# Patient Record
Sex: Male | Born: 1990 | Race: White | Hispanic: No | Marital: Single | Smoking: Current every day smoker
Health system: Southern US, Community
[De-identification: ages and names within clinical notes are randomized; demographics above are authoritative.]

## PROBLEM LIST (undated history)

## (undated) DIAGNOSIS — B1920 Unspecified viral hepatitis C without hepatic coma: Secondary | ICD-10-CM

## (undated) DIAGNOSIS — F1910 Other psychoactive substance abuse, uncomplicated: Secondary | ICD-10-CM

## (undated) DIAGNOSIS — J45909 Unspecified asthma, uncomplicated: Secondary | ICD-10-CM

## (undated) HISTORY — DX: Other psychoactive substance abuse, uncomplicated: F19.10

## (undated) HISTORY — DX: Unspecified viral hepatitis C without hepatic coma: B19.20

## (undated) HISTORY — PX: NO PAST SURGERIES: SHX2092

---
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
## 2010-01-21 ENCOUNTER — Ambulatory Visit: Payer: Self-pay | Admitting: Diagnostic Radiology

## 2010-01-21 ENCOUNTER — Emergency Department (HOSPITAL_BASED_OUTPATIENT_CLINIC_OR_DEPARTMENT_OTHER): Admission: EM | Admit: 2010-01-21 | Discharge: 2010-01-21 | Payer: Self-pay | Admitting: Emergency Medicine

## 2010-03-11 ENCOUNTER — Emergency Department (HOSPITAL_BASED_OUTPATIENT_CLINIC_OR_DEPARTMENT_OTHER): Admission: EM | Admit: 2010-03-11 | Discharge: 2010-03-12 | Payer: Self-pay | Admitting: Emergency Medicine

## 2010-04-10 ENCOUNTER — Emergency Department (HOSPITAL_BASED_OUTPATIENT_CLINIC_OR_DEPARTMENT_OTHER)
Admission: EM | Admit: 2010-04-10 | Discharge: 2010-04-10 | Payer: Self-pay | Source: Home / Self Care | Admitting: Emergency Medicine

## 2010-04-16 ENCOUNTER — Emergency Department (HOSPITAL_BASED_OUTPATIENT_CLINIC_OR_DEPARTMENT_OTHER)
Admission: EM | Admit: 2010-04-16 | Discharge: 2010-04-16 | Payer: Self-pay | Source: Home / Self Care | Admitting: Emergency Medicine

## 2010-05-07 ENCOUNTER — Encounter: Payer: Self-pay | Admitting: Family Medicine

## 2010-05-07 ENCOUNTER — Encounter: Payer: Self-pay | Admitting: Internal Medicine

## 2010-06-29 LAB — BASIC METABOLIC PANEL
CO2: 28 mEq/L (ref 19–32)
Calcium: 9.7 mg/dL (ref 8.4–10.5)
Creatinine, Ser: 1.2 mg/dL (ref 0.4–1.5)
GFR calc Af Amer: >60 mL/min (ref >60)
GFR calc non Af Amer: >60 mL/min (ref >60)
Glucose, Bld: 88 mg/dL (ref 70–99)
Sodium: 141 mEq/L (ref 135–145)

## 2010-06-29 LAB — URINE CULTURE: Culture: NO GROWTH

## 2010-06-29 LAB — DIFFERENTIAL
Basophils Absolute: 0.1 10*3/uL (ref 0.0–0.1)
Basophils Relative: 1 % (ref 0–1)
Eosinophils Absolute: 0.2 10*3/uL (ref 0.0–0.7)
Neutro Abs: 5 10*3/uL (ref 1.7–7.7)
Neutrophils Relative %: 71 % (ref 43–77)

## 2010-06-29 LAB — URINALYSIS, ROUTINE W REFLEX MICROSCOPIC
Nitrite: NEGATIVE
Protein, ur: NEGATIVE mg/dL
Specific Gravity, Urine: 1.028 (ref 1.005–1.030)
Urobilinogen, UA: 0.2 mg/dL (ref 0.0–1.0)

## 2010-06-29 LAB — CBC
Hemoglobin: 15.5 g/dL (ref 13.0–17.0)
MCH: 29.2 pg (ref 26.0–34.0)
MCHC: 33.2 g/dL (ref 30.0–36.0)
Platelets: 245 10*3/uL (ref 150–400)

## 2010-09-14 ENCOUNTER — Emergency Department (HOSPITAL_COMMUNITY)
Admission: EM | Admit: 2010-09-14 | Discharge: 2010-09-15 | Disposition: A | Discharge status: Other Acute Inpatient Hospital | Payer: Self-pay | Attending: Emergency Medicine | Admitting: Emergency Medicine

## 2010-09-14 DIAGNOSIS — F411 Generalized anxiety disorder: Secondary | ICD-10-CM | POA: Insufficient documentation

## 2010-09-14 DIAGNOSIS — R45851 Suicidal ideations: Secondary | ICD-10-CM | POA: Insufficient documentation

## 2010-09-14 DIAGNOSIS — F22 Delusional disorders: Secondary | ICD-10-CM | POA: Insufficient documentation

## 2010-09-14 LAB — COMPREHENSIVE METABOLIC PANEL
AST: 17 U/L (ref 0–37)
Albumin: 4.8 g/dL (ref 3.5–5.2)
Alkaline Phosphatase: 97 U/L (ref 39–117)
BUN: 12 mg/dL (ref 6–23)
Creatinine, Ser: 0.98 mg/dL (ref 0.4–1.5)
GFR calc Af Amer: >60 mL/min (ref >60)
Potassium: 3.9 mEq/L (ref 3.5–5.1)
Total Protein: 7.9 g/dL (ref 6.0–8.3)

## 2010-09-14 LAB — CBC
HCT: 46.7 % (ref 39.0–52.0)
Hemoglobin: 15.9 g/dL (ref 13.0–17.0)
MCHC: 34 g/dL (ref 30.0–36.0)
MCV: 84.9 fL (ref 78.0–100.0)

## 2010-09-14 LAB — RAPID URINE DRUG SCREEN, HOSP PERFORMED
Amphetamines: NOT DETECTED
Opiates: NOT DETECTED
Tetrahydrocannabinol: POSITIVE — AB

## 2010-09-14 LAB — DIFFERENTIAL
Basophils Absolute: 0 10*3/uL (ref 0.0–0.1)
Lymphocytes Relative: 17 % (ref 12–46)
Lymphs Abs: 1.8 10*3/uL (ref 0.7–4.0)
Monocytes Absolute: 0.7 10*3/uL (ref 0.1–1.0)
Monocytes Relative: 7 % (ref 3–12)
Neutro Abs: 7.8 10*3/uL — ABNORMAL HIGH (ref 1.7–7.7)

## 2010-09-15 ENCOUNTER — Inpatient Hospital Stay (HOSPITAL_COMMUNITY)
Admission: AD | Admit: 2010-09-15 | Discharge: 2010-09-17 | DRG: 885 | Disposition: A | Discharge status: Home / Self Care | Payer: PRIVATE HEALTH INSURANCE | Source: Ambulatory Visit | Attending: Psychiatry | Admitting: Psychiatry

## 2010-09-15 DIAGNOSIS — F319 Bipolar disorder, unspecified: Secondary | ICD-10-CM

## 2010-09-15 DIAGNOSIS — Z880 Allergy status to penicillin: Secondary | ICD-10-CM

## 2010-09-15 DIAGNOSIS — Z882 Allergy status to sulfonamides status: Secondary | ICD-10-CM

## 2010-09-15 DIAGNOSIS — Z9119 Patient's noncompliance with other medical treatment and regimen: Secondary | ICD-10-CM

## 2010-09-15 DIAGNOSIS — F909 Attention-deficit hyperactivity disorder, unspecified type: Secondary | ICD-10-CM

## 2010-09-15 DIAGNOSIS — Z91199 Patient's noncompliance with other medical treatment and regimen due to unspecified reason: Secondary | ICD-10-CM

## 2010-09-15 DIAGNOSIS — F39 Unspecified mood [affective] disorder: Secondary | ICD-10-CM

## 2010-09-15 DIAGNOSIS — R45851 Suicidal ideations: Secondary | ICD-10-CM

## 2010-09-15 DIAGNOSIS — Z818 Family history of other mental and behavioral disorders: Secondary | ICD-10-CM

## 2010-09-15 DIAGNOSIS — F1210 Cannabis abuse, uncomplicated: Secondary | ICD-10-CM

## 2010-09-20 NOTE — Discharge Summary (Signed)
  NAMESHAHEER, BONFIELD NO.:  [id number]  MEDICAL RECORD NO.:  [id number]  LOCATION:  0505                          FACILITY:  [facility]  PHYSICIAN:  Franchot Gallo, MD     DATE OF BIRTH:  Dec 06, 1990  DATE OF ADMISSION:  09/15/2010 DATE OF DISCHARGE:  09/17/2010                              DISCHARGE SUMMARY   REASON FOR ADMISSION:  This was a 20 year old male who was here on involuntary papers taken out by his mother with papers saying the patient was having suicidal thoughts and the patient was having a plan to carry out suicide by using a knife, stating that he was going to kill himself.  FINAL IMPRESSION:  AXIS I:  Bipolar by history.  Mood disorder NOS. AXIS II: Deferred. AXIS III: No known medical conditions. AXIS IV: Problems with primary support group, occupation, economic issues. AXIS V:  50-55.  PERTINENT LABS:  Alcohol level less than 11.  Urine drug screen positive for marijuana.  PERTINENT FINDINGS:  This is an alert and oriented male, appropriately groomed, dressed and nourished.  She was admitted to the adult milieu. Contact was parents.  We did initiate the Risperdal for anxiety and to help with the mood stabilization.  He was attending groups.  We had contact with the patient's mother, Aggie Cosier, to provide information and assess any safety concerns.  Mother states that she had no safety concerns for the patient's discharge.  On day of discharge, the patient denied any suicidal or homicidal thoughts.  Felt ready for discharge. Mother was contacted.  She stated that she felt the patient was safe to come home.  His follow-up appointment was with Dr. Evelene Croon on September 18, 2010, and the patient's mother to call for an appointment.     Landry Corporal, N.P.   ______________________________ Franchot Gallo, MD    JO/MEDQ  D:  09/19/2010  T:  09/19/2010  Job:  [id number]  Electronically Signed by Limmie PatriciaP. on 09/20/2010 05:12:48  PM Electronically Signed by Franchot Gallo MD on 09/20/2010 05:19:15 PM

## 2010-09-24 NOTE — H&P (Signed)
Kevin Mack, Kevin Mack NO.:  [id number]  MEDICAL RECORD NO.:  [id number]           PATIENT TYPE:  I  LOCATION:  0505                          FACILITY:  [facility]  PHYSICIAN:  Franchot Gallo, MD     DATE OF BIRTH:  04-28-1990  DATE OF ADMISSION:  09/15/2010 DATE OF DISCHARGE:                      PSYCHIATRIC ADMISSION ASSESSMENT   This is an involuntary admission to the services of Dr. Harvie Heck Elle Vezina.  This is a 20 year old single white male.  His mother took out commitment papers. The commitment papers read that he was having suicidal ideation. He planned to carry out suicide by using a knife. He had held a knife against his neck today and said he was going to kill himself.  He has been diagnosed as ADHD and bipolar.  He has been prescribed Adderall and Lamictal but refuses to take the meds. The respondent has not been committed in the past but he is felt to represent a danger to himself.  PAST PSYCHIATRIC HISTORY:  According to the paperwork he was diagnosed at age 28 with ADD and bipolar.  He himself tells me he quit taking his medication about a year ago.  SOCIAL HISTORY:  He went to the eleventh grade.  He has never married. He has no children.  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 This employment ended around December and he has been at his parents' home since then.  FAMILY HISTORY:  He states that almost every family member has psychiatric illness.  Indeed, his mother and father have both been admitted here at points in the past.  His mother and father and two brothers all have mood disorder of some sort.  ALCOHOL AND DRUG HISTORY:  He does use marijuana. It helps him "calm down."  He has been using since he was a teenager.  PRIMARY CARE PROVIDER:  He does not have one at present.  He has not been to [facility] in about a year but when he was last there he was prescribed Lamictal.  MEDICAL PROBLEMS:  None are  known.  MEDICATIONS:  None are known.  DRUG ALLERGIES:  Penicillin and sulfa.  POSITIVE PHYSICAL FINDINGS:  He was medically cleared in the ED at [facility].  He was afebrile.  His temperature was 97.5-98.7.  His respirations ranged from 16-20.  His pulse from 52-102 and his blood pressure from 101/69 to 132/68.  LAB STUDIES:  Showed that he had no measurable alcohol.  His UDS was positive for marijuana only. He had no abnormalities of CBC nor metabolic profile.  MENTAL STATUS EXAM:  Today he is alert and oriented.  He is appropriately groomed, dressed and nourished.  He is dressed in hospital scrubs.  His speech is not pressured.  His mood is calm. His affect is in normal range. His thought processes are clear, rational and goal oriented.  Concentration and memory are intact.  Intelligence is average and judgment and insight appear to be fair.  He convincingly denies being suicidal or homicidal.  He denies auditory or visual hallucinations.  He does acknowledge holding a knife in  his hand yesterday. He denies actually holding it at his neck.  Apparently his father was going to let some man who has been at their home before, help with remodelling the house.  In the past according to the patient the man had said something to the effect that he wished the patient and his brother were dead so he can get together with his mother. The patient was unusually insistent that this man not be in the house and this was what led to the commitment papers. He was seen in the emergency department by Dr. Rogers Blocker.  Dr. Rogers Blocker did not note any flight of ideas.  He did not seem to be delusional.  He was alert and oriented. He felt that the patient was not suicidal or homicidal; however, he did feel that the patient had a mood disorder NOS and he felt that he should be started on Risperdal.  DIAGNOSES:  AXIS I:  Diagnosis is bipolar by history, ADHD by history, mood disorder NOS. AXIS II:   Deferred. AXIS III:  Healthy. AXIS IV:  Problems with primary support group, occupation, economic issues. AXIS V:  35.  PLAN:  Admit for safety and stabilization.  It was explained to the patient that because he is on involuntary commitment, the first item on the agenda will be to get him converted to a voluntary admission.  We would have the social worker or counselor contact his parents and come in for a session where if they felt it was reasonable for him to be discharged, we could contemplate discharge as per his request. Estimated length of stay is 1-2 days.     Mickie Leonarda Salon, P.A.-C.   ______________________________ Franchot Gallo, MD    MD/MEDQ  D:  09/15/2010  T:  09/16/2010  Job:  [id number]  Electronically Signed by Jaci Lazier ADAMS P.A.-C. on 09/24/2010 09:46:44 AM Electronically Signed by Franchot Gallo MD on 09/24/2010 07:04:34 PM

## 2011-01-12 LAB — POCT RAPID STREP A: Streptococcus, Group A Screen (Direct): NEGATIVE

## 2011-01-12 LAB — POCT INFECTIOUS MONO SCREEN: Mono Screen: NEGATIVE

## 2011-01-15 LAB — RAPID URINE DRUG SCREEN, HOSP PERFORMED
Cocaine: NOT DETECTED
Opiates: NOT DETECTED

## 2011-03-05 ENCOUNTER — Encounter: Payer: Self-pay | Admitting: *Deleted

## 2011-03-05 ENCOUNTER — Emergency Department (HOSPITAL_COMMUNITY): Payer: Self-pay

## 2011-03-05 ENCOUNTER — Emergency Department (HOSPITAL_COMMUNITY)
Admission: EM | Admit: 2011-03-05 | Discharge: 2011-03-05 | Disposition: A | Discharge status: Home / Self Care | Payer: Self-pay | Attending: Emergency Medicine | Admitting: Emergency Medicine

## 2011-03-05 DIAGNOSIS — S61209A Unspecified open wound of unspecified finger without damage to nail, initial encounter: Secondary | ICD-10-CM | POA: Insufficient documentation

## 2011-03-05 DIAGNOSIS — S61218A Laceration without foreign body of other finger without damage to nail, initial encounter: Secondary | ICD-10-CM

## 2011-03-05 DIAGNOSIS — W298XXA Contact with other powered powered hand tools and household machinery, initial encounter: Secondary | ICD-10-CM | POA: Insufficient documentation

## 2011-03-05 MED ORDER — OXYCODONE-ACETAMINOPHEN 5-325 MG PO TABS
2.0000 | ORAL_TABLET | Freq: Once | ORAL | Status: AC
  Administered 2011-03-05: 2 via ORAL
  Filled 2011-03-05: qty 2

## 2011-03-05 MED ORDER — HYDROCODONE-ACETAMINOPHEN 5-500 MG PO TABS: 1.0000 | ORAL_TABLET | Freq: Four times a day (QID) | ORAL | Status: AC | PRN

## 2011-03-05 MED ORDER — CEPHALEXIN 500 MG PO CAPS: 500.0000 mg | ORAL_CAPSULE | Freq: Four times a day (QID) | ORAL | Status: AC

## 2011-03-05 NOTE — Discharge Instructions (Signed)
Fingernail Removal Fingernails may need to be removed because of injury, infections, or correction of abnormal growth. A special non-stick bandage has been put on your finger tightly to prevent bleeding. Fingernails will usually grow back if the finger has not been badly injured and you carefully follow instructions. HOME CARE INSTRUCTIONS   Keep your hand elevated above your heart to relieve pain and swelling.   Keep your dressing dry and clean.   Change your bandage in 24 hours.   After your bandage is changed, soak your hand in warm soapy water for 10 to 20 minutes. Do this 3 times per day. This helps reduce pain and swelling. After soaking your hand, apply a clean, dry bandage. Change your bandage if it is wet or dirty.   Only take over-the-counter or prescription medicines for pain, discomfort, or fever as directed by your caregiver.   See your caregiver as needed for problems.   You may have received an instruction to follow up with your caregiver or a specialist. The failure to follow up as instructed could result in the permanent loss of a fingernail.  SEEK IMMEDIATE MEDICAL CARE IF:   You have increased pain, swelling, drainage, or bleeding.   You have a fever.  MAKE SURE YOU:   Understand these instructions.   Will watch your condition.   Will get help right away if you are not doing well or get worse.  Document Released: 03/30/2000 Document Revised: 12/13/2010 Document Reviewed: 08/05/2007 [facility] Patient Information 2012 [place], [place].Fingertip Laceration The treatment of fingertip injuries depends on how large the cut is and whether the bone or nail tissue has been damaged. Amputations of the skin over the tip of the finger that is smaller than a dime (smaller than 1cm) will usually heal very well from the sides without any treatment other than cleaning the wound and changing the dressing. Keep your hand elevated for the next 2 to 3 days to reduce pain and swelling. A  splint over the fingertip may be needed to protect your injury. If your cut is being allowed to heal in from the sides, you should soak it in warm water and change the dressing daily.  You may need a tetanus shot if:  You cannot remember when you had your last tetanus shot.   You have never had a tetanus shot.   The injury broke your skin.  If you got a tetanus shot, your arm may swell, get red, and feel warm to the touch. This is common and not a problem. If you need a tetanus shot and you choose not to have one, there is a rare chance of getting tetanus. Sickness from tetanus can be serious. SEEK MEDICAL CARE IF:   There are any signs of infection: increased redness, swelling, and pain, or sometimes pus drainage.  Document Released: 05/10/2004 Document Revised: 12/13/2010 Document Reviewed: 05/06/2008 [facility] Patient Information 2012 [place], [place].Laceration Care, Adult A laceration is a cut or lesion that goes through all layers of the skin and into the tissue just beneath the skin. TREATMENT  Some lacerations may not require closure. Some lacerations may not be able to be closed due to an increased risk of infection. It is important to see your caregiver as soon as possible after an injury to minimize the risk of infection and maximize the opportunity for successful closure. If closure is appropriate, pain medicines may be given, if needed. The wound will be cleaned to help prevent infection. Your caregiver will use  stitches (sutures), staples, wound glue (adhesive), or skin adhesive strips to repair the laceration. These tools bring the skin edges together to allow for faster healing and a better cosmetic outcome. However, all wounds will heal with a scar. Once the wound has healed, scarring can be minimized by covering the wound with sunscreen during the day for 1 full year. HOME CARE INSTRUCTIONS  For sutures or staples:  Keep the wound clean and dry.   If you were given a bandage  (dressing), you should change it at least once a day. Also, change the dressing if it becomes wet or dirty, or as directed by your caregiver.   Wash the wound with soap and water 2 times a day. Rinse the wound off with water to remove all soap. Pat the wound dry with a clean towel.   After cleaning, apply a thin layer of the antibiotic ointment as recommended by your caregiver. This will help prevent infection and keep the dressing from sticking.   You may shower as usual after the first 24 hours. Do not soak the wound in water until the sutures are removed.   Only take over-the-counter or prescription medicines for pain, discomfort, or fever as directed by your caregiver.   Get your sutures or staples removed as directed by your caregiver.  For skin adhesive strips:  Keep the wound clean and dry.   Do not get the skin adhesive strips wet. You may bathe carefully, using caution to keep the wound dry.   If the wound gets wet, pat it dry with a clean towel.   Skin adhesive strips will fall off on their own. You may trim the strips as the wound heals. Do not remove skin adhesive strips that are still stuck to the wound. They will fall off in time.  For wound adhesive:  You may briefly wet your wound in the shower or bath. Do not soak or scrub the wound. Do not swim. Avoid periods of heavy perspiration until the skin adhesive has fallen off on its own. After showering or bathing, gently pat the wound dry with a clean towel.   Do not apply liquid medicine, cream medicine, or ointment medicine to your wound while the skin adhesive is in place. This may loosen the film before your wound is healed.   If a dressing is placed over the wound, be careful not to apply tape directly over the skin adhesive. This may cause the adhesive to be pulled off before the wound is healed.   Avoid prolonged exposure to sunlight or tanning lamps while the skin adhesive is in place. Exposure to ultraviolet light in  the first year will darken the scar.   The skin adhesive will usually remain in place for 5 to 10 days, then naturally fall off the skin. Do not pick at the adhesive film.  You may need a tetanus shot if:  You cannot remember when you had your last tetanus shot.   You have never had a tetanus shot.  If you get a tetanus shot, your arm may swell, get red, and feel warm to the touch. This is common and not a problem. If you need a tetanus shot and you choose not to have one, there is a rare chance of getting tetanus. Sickness from tetanus can be serious. SEEK MEDICAL CARE IF:   You have redness, swelling, or increasing pain in the wound.   You see a red line that goes away from the wound.  You have yellowish-white fluid (pus) coming from the wound.   You have a fever.   You notice a bad smell coming from the wound or dressing.   Your wound breaks open before or after sutures have been removed.   You notice something coming out of the wound such as wood or glass.   Your wound is on your hand or foot and you cannot move a finger or toe.  SEEK IMMEDIATE MEDICAL CARE IF:   Your pain is not controlled with prescribed medicine.   You have severe swelling around the wound causing pain and numbness or a change in color in your arm, hand, leg, or foot.   Your wound splits open and starts bleeding.   You have worsening numbness, weakness, or loss of function of any joint around or beyond the wound.   You develop painful lumps near the wound or on the skin anywhere on your body.  MAKE SURE YOU:   Understand these instructions.   Will watch your condition.   Will get help right away if you are not doing well or get worse.  Document Released: 04/02/2005 Document Revised: 12/13/2010 Document Reviewed: 09/26/2010 [facility] Patient Information 2012 [place], [place].

## 2011-03-05 NOTE — ED Notes (Signed)
Pt injured left hand while cutting wood with chainsaw. Bleeding controlled with guaze.

## 2011-03-05 NOTE — ED Notes (Signed)
Affected fingers bandaged and bacitracin applied per order

## 2011-03-05 NOTE — ED Notes (Signed)
Laceration to the lt ring and little fingers with a chain saw approx one hour ago at home.  Bleeding controlled at present

## 2011-03-05 NOTE — ED Provider Notes (Signed)
History     CSN: [id number] Arrival date & time: 03/05/2011  4:45 PM   First MD Initiated Contact with Patient 03/05/11 1828      Chief Complaint  Patient presents with  . Laceration    (Consider location/radiation/quality/duration/timing/severity/associated sxs/prior treatment) Patient is a 20 y.o. male presenting with skin laceration. The history is provided by the patient.  Laceration   pt was cutting fire wood at his home when the chain saw jumped back and cut his left ring and small finger. The bleeding is controlled. He says he can move and feel all five fingers. Denies head injury. Incident happened just prior to arrival. Pt has no significant past medical history.  History reviewed. No pertinent past medical history.  History reviewed. No pertinent past surgical history.  History reviewed. No pertinent family history.  History  Substance Use Topics  . Smoking status: Current Everyday Smoker  . Smokeless tobacco: Not on file  . Alcohol Use: No      Review of Systems  All other systems reviewed and are negative.    Allergies  Penicillins  Home Medications   Current Outpatient Rx  Name Route Sig Dispense Refill  . ACETAMINOPHEN 500 MG PO TABS Oral Take 1,000 mg by mouth as needed. For pain.       BP 129/79  Pulse 53  Temp(Src) 97.6 F (36.4 C) (Oral)  Resp 18  SpO2 97%  Physical Exam  Constitutional: He is oriented to person, place, and time. He appears well-developed and well-nourished.  HENT:  Head: Normocephalic and atraumatic.  Eyes: EOM are normal. Pupils are equal, round, and reactive to light.  Neck: Normal range of motion.  Cardiovascular: Normal rate and regular rhythm.   Pulmonary/Chest: Effort normal and breath sounds normal.  Musculoskeletal: Normal range of motion.       Hands: Neurological: He is alert and oriented to person, place, and time.  Skin: Skin is warm and dry.    ED Course  LACERATION REPAIR Date/Time: 03/05/2011  8:29 PM Performed by: Dorthula Matas Authorized by: Dorthula Matas Consent: Verbal consent obtained. Written consent not obtained. Risks and benefits: risks, benefits and alternatives were discussed Consent given by: patient Patient understanding: patient states understanding of the procedure being performed Patient consent: the patient's understanding of the procedure matches consent given Procedure consent: procedure consent matches procedure scheduled Body area: upper extremity Location details: left small finger Contamination: The wound is contaminated. Foreign bodies: unknown Tendon involvement: none Nerve involvement: none Anesthesia: digital block Local anesthetic: lidocaine 1% without epinephrine Patient sedated: no Preparation: Patient was prepped and draped in the usual sterile fashion. Irrigation solution: saline Amount of cleaning: extensive Debridement: none Degree of undermining: none Skin closure: 4-0 nylon Number of sutures: 3 Technique: simple Approximation: close Approximation difficulty: simple Dressing: 4x4 sterile gauze, antibiotic ointment and splint   (including critical care time)  Labs Reviewed - No data to display No results found.   No diagnosis found.    MDM  xrays are both negative for fracture.       Dorthula Matas, PA 03/05/11 2048

## 2011-03-07 NOTE — ED Provider Notes (Signed)
Medical screening examination/treatment/procedure(s) were performed by non-physician practitioner and as supervising physician I was immediately available for consultation/collaboration.  Raeford Razor, MD 03/07/11 [phone]

## 2012-04-27 ENCOUNTER — Emergency Department (HOSPITAL_BASED_OUTPATIENT_CLINIC_OR_DEPARTMENT_OTHER)
Admission: EM | Admit: 2012-04-27 | Discharge: 2012-04-27 | Disposition: A | Discharge status: Home / Self Care | Payer: Self-pay | Attending: Emergency Medicine | Admitting: Emergency Medicine

## 2012-04-27 ENCOUNTER — Emergency Department (HOSPITAL_BASED_OUTPATIENT_CLINIC_OR_DEPARTMENT_OTHER): Payer: Self-pay

## 2012-04-27 ENCOUNTER — Encounter (HOSPITAL_BASED_OUTPATIENT_CLINIC_OR_DEPARTMENT_OTHER): Payer: Self-pay | Admitting: *Deleted

## 2012-04-27 DIAGNOSIS — F17200 Nicotine dependence, unspecified, uncomplicated: Secondary | ICD-10-CM | POA: Insufficient documentation

## 2012-04-27 DIAGNOSIS — X58XXXA Exposure to other specified factors, initial encounter: Secondary | ICD-10-CM | POA: Insufficient documentation

## 2012-04-27 DIAGNOSIS — Y9383 Activity, rough housing and horseplay: Secondary | ICD-10-CM | POA: Insufficient documentation

## 2012-04-27 DIAGNOSIS — IMO0002 Reserved for concepts with insufficient information to code with codable children: Secondary | ICD-10-CM | POA: Insufficient documentation

## 2012-04-27 DIAGNOSIS — Y929 Unspecified place or not applicable: Secondary | ICD-10-CM | POA: Insufficient documentation

## 2012-04-27 MED ORDER — HYDROCODONE-ACETAMINOPHEN 5-325 MG PO TABS: 2.0000 | ORAL_TABLET | ORAL | Status: AC | PRN

## 2012-04-27 MED ORDER — IBUPROFEN 800 MG PO TABS: 800.0000 mg | ORAL_TABLET | Freq: Three times a day (TID) | ORAL | Status: DC

## 2012-04-27 NOTE — ED Notes (Signed)
Pt states he was "horsing around" with his brother yesterday and injured his lower back. Brought to ED by EMS and ambulatory to ED.

## 2012-04-27 NOTE — ED Provider Notes (Signed)
History     CSN: [id number]  Arrival date & time 04/27/12  1528   First MD Initiated Contact with Patient 04/27/12 1706      Chief Complaint  Patient presents with  . Back Pain    (Consider location/radiation/quality/duration/timing/severity/associated sxs/prior treatment) Patient is a 22 y.o. male presenting with back pain. The history is provided by the patient. No language interpreter was used.  Back Pain  This is a new problem. The current episode started yesterday. The problem occurs constantly. The problem has been gradually worsening. The pain is associated with twisting. The quality of the pain is described as aching. The pain is at a severity of 7/10. The pain is moderate. The pain is the same all the time. Stiffness is present all day. He has tried nothing for the symptoms. The treatment provided moderate relief. Risk factors: none.    History reviewed. No pertinent past medical history.  History reviewed. No pertinent past surgical history.  History reviewed. No pertinent family history.  History  Substance Use Topics  . Smoking status: Current Every Day Smoker  . Smokeless tobacco: Not on file  . Alcohol Use: No      Review of Systems  Musculoskeletal: Positive for back pain.  All other systems reviewed and are negative.    Allergies  Penicillins and Sulfa antibiotics  Home Medications   Current Outpatient Rx  Name  Route  Sig  Dispense  Refill  . ACETAMINOPHEN 500 MG PO TABS   Oral   Take 1,000 mg by mouth as needed. For pain.            BP 133/72  Pulse 54  Temp 98 F (36.7 C) (Oral)  Resp 18  Ht 6' (1.829 m)  Wt 155 lb (70.308 kg)  BMI 21.02 kg/m2  SpO2 100%  Physical Exam  Nursing note and vitals reviewed. Constitutional: He appears well-developed and well-nourished.  HENT:  Head: Normocephalic and atraumatic.  Nose: Nose normal.  Mouth/Throat: Oropharynx is clear and moist.  Eyes: Pupils are equal, round, and reactive to light.   Neck: Normal range of motion.  Cardiovascular: Normal rate and normal heart sounds.   Pulmonary/Chest: Effort normal and breath sounds normal.  Abdominal: Soft.  Musculoskeletal: Normal range of motion.  Neurological: He is alert.  Skin: Skin is warm.  Psychiatric: He has a normal mood and affect.    ED Course  Procedures (including critical care time)  Labs Reviewed - No data to display No results found.   1. Back sprain       MDM  Ibuprofen and hydrocodone  For pain,  Follow up with Dr. Pearletha Forge for recheck in 1 week if pain persist       Lonia Skinner [place], [place] 04/27/12 1757

## 2012-04-27 NOTE — ED Provider Notes (Signed)
Medical screening examination/treatment/procedure(s) were performed by non-physician practitioner and as supervising physician I was immediately available for consultation/collaboration.   Gavin Pound. Amani Marseille, MD 04/27/12 1800

## 2012-09-24 ENCOUNTER — Emergency Department (HOSPITAL_BASED_OUTPATIENT_CLINIC_OR_DEPARTMENT_OTHER)
Admission: EM | Admit: 2012-09-24 | Discharge: 2012-09-24 | Discharge status: Against Medical Advice | Payer: Self-pay | Attending: Emergency Medicine | Admitting: Emergency Medicine

## 2012-09-24 ENCOUNTER — Emergency Department (HOSPITAL_BASED_OUTPATIENT_CLINIC_OR_DEPARTMENT_OTHER): Payer: Self-pay

## 2012-09-24 ENCOUNTER — Encounter (HOSPITAL_BASED_OUTPATIENT_CLINIC_OR_DEPARTMENT_OTHER): Payer: Self-pay | Admitting: *Deleted

## 2012-09-24 DIAGNOSIS — R0602 Shortness of breath: Secondary | ICD-10-CM | POA: Insufficient documentation

## 2012-09-24 DIAGNOSIS — J45901 Unspecified asthma with (acute) exacerbation: Secondary | ICD-10-CM | POA: Insufficient documentation

## 2012-09-24 DIAGNOSIS — F17200 Nicotine dependence, unspecified, uncomplicated: Secondary | ICD-10-CM | POA: Insufficient documentation

## 2012-09-24 HISTORY — DX: Unspecified asthma, uncomplicated: J45.909

## 2012-09-24 NOTE — ED Notes (Signed)
Pt c/o sob x 2 days. 

## 2012-09-24 NOTE — ED Notes (Signed)
See EDP assessment-not see/assessed by this RN prior to pt leaving ED

## 2013-04-25 ENCOUNTER — Emergency Department (HOSPITAL_BASED_OUTPATIENT_CLINIC_OR_DEPARTMENT_OTHER): Payer: Self-pay

## 2013-04-25 ENCOUNTER — Emergency Department (HOSPITAL_BASED_OUTPATIENT_CLINIC_OR_DEPARTMENT_OTHER)
Admission: EM | Admit: 2013-04-25 | Discharge: 2013-04-25 | Disposition: A | Discharge status: Home / Self Care | Payer: Self-pay | Attending: Emergency Medicine | Admitting: Emergency Medicine

## 2013-04-25 ENCOUNTER — Encounter (HOSPITAL_BASED_OUTPATIENT_CLINIC_OR_DEPARTMENT_OTHER): Payer: Self-pay | Admitting: Emergency Medicine

## 2013-04-25 DIAGNOSIS — R6889 Other general symptoms and signs: Secondary | ICD-10-CM

## 2013-04-25 DIAGNOSIS — IMO0001 Reserved for inherently not codable concepts without codable children: Secondary | ICD-10-CM | POA: Insufficient documentation

## 2013-04-25 DIAGNOSIS — J111 Influenza due to unidentified influenza virus with other respiratory manifestations: Secondary | ICD-10-CM | POA: Insufficient documentation

## 2013-04-25 DIAGNOSIS — Z880 Allergy status to penicillin: Secondary | ICD-10-CM | POA: Insufficient documentation

## 2013-04-25 DIAGNOSIS — J45909 Unspecified asthma, uncomplicated: Secondary | ICD-10-CM | POA: Insufficient documentation

## 2013-04-25 DIAGNOSIS — F17200 Nicotine dependence, unspecified, uncomplicated: Secondary | ICD-10-CM | POA: Insufficient documentation

## 2013-04-25 MED ORDER — HYDROCODONE-ACETAMINOPHEN 5-325 MG PO TABS: 1.0000 | ORAL_TABLET | Freq: Four times a day (QID) | ORAL | Status: DC | PRN

## 2013-04-25 MED ORDER — GUAIFENESIN 100 MG/5ML PO LIQD: 100.0000 mg | ORAL | Status: DC | PRN

## 2013-04-25 NOTE — ED Provider Notes (Signed)
CSN: [id number]     Arrival date & time 04/25/13  1236 History   First MD Initiated Contact with Patient 04/25/13 1411     Chief Complaint  Patient presents with  . Generalized Body Aches  . Nasal Congestion   (Consider location/radiation/quality/duration/timing/severity/associated sxs/prior Treatment) Patient is a 23 y.o. male presenting with cough. The history is provided by the patient.  Cough Cough characteristics:  Productive Severity:  Moderate Onset quality:  Gradual Duration:  3 days Timing:  Intermittent Progression:  Worsening Chronicity:  New Smoker: yes   Context: upper respiratory infection   Relieved by:  None tried Worsened by:  Activity and lying down Ineffective treatments:  None tried Associated symptoms: ear fullness, myalgias, rhinorrhea, sinus congestion and sore throat   Associated symptoms: no chest pain, no chills, no fever, no headaches, no rash, no shortness of breath and no wheezing    Kevin Mack is a 23 y.o. male who presents to the ED with cough, cold and congestion that started 3 days ago. His cough is productive. He is unsure of fever. He starts coughing and can't stop. He has been unable to sleep.  Past Medical History  Diagnosis Date  . Asthma    History reviewed. No pertinent past surgical history. No family history on file. History  Substance Use Topics  . Smoking status: Current Every Day Smoker -- 0.50 packs/day    Types: Cigarettes  . Smokeless tobacco: Not on file  . Alcohol Use: No    Review of Systems  Constitutional: Negative for fever and chills.  HENT: Positive for rhinorrhea, sinus pressure and sore throat. Negative for facial swelling and trouble swallowing.   Eyes: Negative for redness.  Respiratory: Positive for cough. Negative for shortness of breath and wheezing.   Cardiovascular: Negative for chest pain.  Gastrointestinal: Negative for nausea, vomiting, abdominal pain and diarrhea.  Musculoskeletal: Positive for  myalgias.  Skin: Negative for rash.  Neurological: Negative for dizziness and headaches.  Psychiatric/Behavioral: Negative for confusion. The patient is not nervous/anxious.     Allergies  Penicillins and Sulfa antibiotics  Home Medications  No current outpatient prescriptions on file. BP 148/102  Pulse 93  Temp(Src) 98.6 F (37 C) (Oral)  Resp 22  Ht 6' (1.829 m)  Wt 156 lb 6 oz (70.931 kg)  BMI 21.20 kg/m2  SpO2 98% Physical Exam  Nursing note and vitals reviewed. Constitutional: He is oriented to person, place, and time. He appears well-developed and well-nourished.  HENT:  Head: Normocephalic and atraumatic.  Right Ear: Tympanic membrane normal.  Left Ear: Tympanic membrane normal.  Nose: Nose normal.  Mouth/Throat: Uvula is midline, oropharynx is clear and moist and mucous membranes are normal.  Eyes: EOM are normal.  Neck: Normal range of motion. Neck supple.  Cardiovascular: Normal rate and regular rhythm.   Pulmonary/Chest: Effort normal. He has no wheezes. He has no rales.  Abdominal: Soft. There is no tenderness.  Musculoskeletal: Normal range of motion.  Lymphadenopathy:    He has no cervical adenopathy.  Neurological: He is alert and oriented to person, place, and time. No cranial nerve deficit.  Skin: Skin is warm and dry.  Psychiatric: He has a normal mood and affect. His behavior is normal.    ED Course  Procedures (including critical care time) Labs Review Labs Reviewed - No data to display Imaging Review Dg Chest 2 View  04/25/2013   CLINICAL DATA:  Cough, congestion  EXAM: CHEST  2 VIEW  COMPARISON:  09/24/2012  FINDINGS: The heart size and mediastinal contours are within normal limits. Both lungs are clear. The visualized skeletal structures are unremarkable.  IMPRESSION: No active cardiopulmonary disease.   Electronically Signed   By: Ruel Favorsrevor  Shick M.D.   On: 04/25/2013 13:44     MDM  23 y.o. male with flu like symptoms SUBJECTIVE:  Kevin Reddendam  Mack is a 23 y.o. male who present complaining of flu-like symptoms: fevers, chills, myalgias, congestion, sore throat and cough for 3 days. Denies dyspnea or wheezing.  OBJECTIVE: Appears moderately ill but not toxic; temperature as noted in vitals. Ears normal. Throat and pharynx normal.  Neck supple. No adenopathy in the neck. Sinuses non tender. The chest is clear.  ASSESSMENT: Influenza  PLAN: Symptomatic therapy suggested: rest, increase fluids, gargle prn for sore throat, use mist of vaporizer prn and call prn if symptoms persist or worsen. Call or return to the ED prn if these symptoms worsen or fail to improve as anticipated.     Janne NapoleonHope M Neese, [place]NP 04/26/13 [phone]

## 2013-04-25 NOTE — ED Notes (Signed)
Patient here with cough and congestion that started on Wednesday with chestwall pain. On assessment no distress

## 2013-04-25 NOTE — Discharge Instructions (Signed)
Take ibuprofen as needed for fever or aching. Do not take the narcotic that we give you for your cough if you are driving as it will make you sleepy. Be sure you are drinking plenty of fluids so you don't get dehydrated.

## 2013-04-27 NOTE — ED Provider Notes (Signed)
Medical screening examination/treatment/procedure(s) were performed by non-physician practitioner and as supervising physician I was immediately available for consultation/collaboration.  EKG Interpretation   None        Enid SkeensJoshua M Raushanah Osmundson, MD 04/27/13 [phone]

## 2013-07-21 ENCOUNTER — Encounter (HOSPITAL_BASED_OUTPATIENT_CLINIC_OR_DEPARTMENT_OTHER): Payer: Self-pay | Admitting: Emergency Medicine

## 2013-07-21 ENCOUNTER — Emergency Department (HOSPITAL_BASED_OUTPATIENT_CLINIC_OR_DEPARTMENT_OTHER)
Admission: EM | Admit: 2013-07-21 | Discharge: 2013-07-21 | Disposition: A | Discharge status: Home / Self Care | Payer: Self-pay | Attending: Emergency Medicine | Admitting: Emergency Medicine

## 2013-07-21 DIAGNOSIS — J45909 Unspecified asthma, uncomplicated: Secondary | ICD-10-CM | POA: Insufficient documentation

## 2013-07-21 DIAGNOSIS — F17200 Nicotine dependence, unspecified, uncomplicated: Secondary | ICD-10-CM | POA: Insufficient documentation

## 2013-07-21 DIAGNOSIS — Z880 Allergy status to penicillin: Secondary | ICD-10-CM | POA: Insufficient documentation

## 2013-07-21 DIAGNOSIS — Y9383 Activity, rough housing and horseplay: Secondary | ICD-10-CM | POA: Insufficient documentation

## 2013-07-21 DIAGNOSIS — IMO0002 Reserved for concepts with insufficient information to code with codable children: Secondary | ICD-10-CM | POA: Insufficient documentation

## 2013-07-21 DIAGNOSIS — T148XXA Other injury of unspecified body region, initial encounter: Secondary | ICD-10-CM

## 2013-07-21 DIAGNOSIS — Y929 Unspecified place or not applicable: Secondary | ICD-10-CM | POA: Insufficient documentation

## 2013-07-21 DIAGNOSIS — Z23 Encounter for immunization: Secondary | ICD-10-CM | POA: Insufficient documentation

## 2013-07-21 MED ORDER — TETANUS-DIPHTH-ACELL PERTUSSIS 5-2.5-18.5 LF-MCG/0.5 IM SUSP
0.5000 mL | Freq: Once | INTRAMUSCULAR | Status: AC
  Administered 2013-07-21: 0.5 mL via INTRAMUSCULAR
  Filled 2013-07-21: qty 0.5

## 2013-07-21 MED ORDER — IBUPROFEN 800 MG PO TABS
800.0000 mg | ORAL_TABLET | Freq: Once | ORAL | Status: AC
  Administered 2013-07-21: 800 mg via ORAL
  Filled 2013-07-21: qty 1

## 2013-07-21 NOTE — Discharge Instructions (Signed)
Abrasion °An abrasion is a cut or scrape of the skin. Abrasions do not extend through all layers of the skin and most heal within 10 days. It is important to care for your abrasion properly to prevent infection. °CAUSES  °Most abrasions are caused by falling on, or gliding across, the ground or other surface. When your skin rubs on something, the outer and inner layer of skin rubs off, causing an abrasion. °DIAGNOSIS  °Your caregiver will be able to diagnose an abrasion during a physical exam.  °TREATMENT  °Your treatment depends on how large and deep the abrasion is. Generally, your abrasion will be cleaned with water and a mild soap to remove any dirt or debris. An antibiotic ointment may be put over the abrasion to prevent an infection. A bandage (dressing) may be wrapped around the abrasion to keep it from getting dirty.  °You may need a tetanus shot if: °· You cannot remember when you had your last tetanus shot. °· You have never had a tetanus shot. °· The injury broke your skin. °If you get a tetanus shot, your arm may swell, get red, and feel warm to the touch. This is common and not a problem. If you need a tetanus shot and you choose not to have one, there is a rare chance of getting tetanus. Sickness from tetanus can be serious.  °HOME CARE INSTRUCTIONS  °· If a dressing was applied, change it at least once a day or as directed by your caregiver. If the bandage sticks, soak it off with warm water.   °· Wash the area with water and a mild soap to remove all the ointment 2 times a day. Rinse off the soap and pat the area dry with a clean towel.   °· Reapply any ointment as directed by your caregiver. This will help prevent infection and keep the bandage from sticking. Use gauze over the wound and under the dressing to help keep the bandage from sticking.   °· Change your dressing right away if it becomes wet or dirty.   °· Only take over-the-counter or prescription medicines for pain, discomfort, or fever as  directed by your caregiver.   °· Follow up with your caregiver within 24 48 hours for a wound check, or as directed. If you were not given a wound-check appointment, look closely at your abrasion for redness, swelling, or pus. These are signs of infection. °SEEK IMMEDIATE MEDICAL CARE IF:  °· You have increasing pain in the wound.   °· You have redness, swelling, or tenderness around the wound.   °· You have pus coming from the wound.   °· You have a fever or persistent symptoms for more than 2 3 days. °· You have a fever and your symptoms suddenly get worse. °· You have a bad smell coming from the wound or dressing.   °MAKE SURE YOU:  °· Understand these instructions. °· Will watch your condition. °· Will get help right away if you are not doing well or get worse. °Document Released: 01/10/2005 Document Revised: 03/19/2012 Document Reviewed: 03/06/2011 °[employer]® Patient Information ©2014 [employer]. ° °
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
## 2016-07-03 ENCOUNTER — Encounter (HOSPITAL_COMMUNITY): Payer: Self-pay | Admitting: Emergency Medicine

## 2016-07-03 ENCOUNTER — Emergency Department (HOSPITAL_COMMUNITY)
Admission: EM | Admit: 2016-07-03 | Discharge: 2016-07-03 | Disposition: A | Discharge status: Home / Self Care | Payer: Self-pay | Attending: Emergency Medicine | Admitting: Emergency Medicine

## 2016-07-03 DIAGNOSIS — R6889 Other general symptoms and signs: Secondary | ICD-10-CM

## 2016-07-03 DIAGNOSIS — F17210 Nicotine dependence, cigarettes, uncomplicated: Secondary | ICD-10-CM | POA: Insufficient documentation

## 2016-07-03 DIAGNOSIS — J45909 Unspecified asthma, uncomplicated: Secondary | ICD-10-CM | POA: Insufficient documentation

## 2016-07-03 DIAGNOSIS — B349 Viral infection, unspecified: Secondary | ICD-10-CM | POA: Insufficient documentation

## 2016-07-03 NOTE — Discharge Instructions (Signed)
Please read instructions below. Return for shortness of breath, coughing up blood, worsening symptoms, inability to swallow.

## 2016-07-03 NOTE — ED Provider Notes (Signed)
[facility]-EMERGENCY DEPT Provider Note   CSN: [id number] Arrival date & time: 07/03/16  1614     History   Chief Complaint Chief Complaint  Patient presents with  . Generalized Body Aches  . Nasal Congestion    HPI Kevin Mack is a 26 y.o. male.  Pt presents w 3 day hx general malaise, body aches, fever, runny nose, cough, dec appetite. Pt reports 1 episode of vomiting early Sunday morning with rapid onset of sx. Reports F/C, HA, sick contacts w influenza. Pt reports taking theraflu w little relief. Denies SOB, CP, ear pain, sore throat, D/C, dysuria. Pt reports he smokes less than 1 ppd.       Past Medical History:  Diagnosis Date  . Asthma     There are no active problems to display for this patient.   History reviewed. No pertinent surgical history.     Home Medications    Prior to Admission medications   Medication Sig Start Date End Date Taking? Authorizing Provider  guaiFENesin (ROBITUSSIN) 100 MG/5ML liquid Take 5-10 mLs (100-200 mg total) by mouth every 4 (four) hours as needed for cough. 04/25/13   Hope Orlene OchM Neese, NP  HYDROcodone-acetaminophen (NORCO) 5-325 MG per tablet Take 1 tablet by mouth every 6 (six) hours as needed. 04/25/13   Hope Orlene OchM Neese, NP    Family History History reviewed. No pertinent family history.  Social History Social History  Substance Use Topics  . Smoking status: Current Every Day Smoker    Packs/day: 0.50    Types: Cigarettes  . Smokeless tobacco: Not on file  . Alcohol use No     Allergies   Penicillins and Sulfa antibiotics   Review of Systems Review of Systems  Constitutional: Positive for appetite change, chills, fatigue and fever.  HENT: Positive for rhinorrhea. Negative for ear pain, sore throat and trouble swallowing.   Respiratory: Negative for shortness of breath.   Cardiovascular: Negative for chest pain.  Gastrointestinal: Positive for nausea and vomiting. Negative for abdominal pain, constipation and diarrhea.   Genitourinary: Negative for dysuria.  Musculoskeletal: Positive for myalgias.  Neurological: Positive for headaches.  All other systems reviewed and are negative.    Physical Exam Updated Vital Signs BP 132/81 (BP Location: Right Arm)   Pulse 80   Temp 99.4 F (37.4 C) (Oral)   Resp 18   SpO2 98%   Physical Exam  Constitutional: He is oriented to person, place, and time. He appears well-developed and well-nourished. No distress.  HENT:  Head: Normocephalic and atraumatic.  Right Ear: Tympanic membrane, external ear and ear canal normal.  Left Ear: Tympanic membrane, external ear and ear canal normal.  Oropharynx mildly erythematous, no exudates. No cervical adenopathy. Uvula midline  Eyes: Conjunctivae are normal.  Neck: Normal range of motion. Neck supple.  Cardiovascular: Normal rate, regular rhythm, normal heart sounds and intact distal pulses.   No murmur heard. Pulmonary/Chest: Effort normal and breath sounds normal. No respiratory distress. He has no wheezes. He has no rhonchi. He has no rales.  Abdominal: Soft. Bowel sounds are normal. He exhibits no distension and no mass. There is no tenderness.  Neurological: He is alert and oriented to person, place, and time.  Skin: Skin is warm and dry.  Nursing note and vitals reviewed.    ED Treatments / Results  Labs (all labs ordered are listed, but only abnormal results are displayed) Labs Reviewed - No data to display  EKG  EKG Interpretation None  Radiology No results found.  Procedures Procedures (including critical care time)  Medications Ordered in ED Medications - No data to display   Initial Impression / Assessment and Plan / ED Course  I have reviewed the triage vital signs and the nursing notes.  Pertinent labs & imaging results that were available during my care of the patient were reviewed by me and considered in my medical decision making (see chart for details).     Pt presents w 3  day hx acute onset myalgias, F/C, runny nose, cough,, HA. On exam, lungs clear, abd benign, mildly erythematous pharynx w/o exudates, TMs clear. Due to acute onset, sx and sick contacts, likely influenza. Pt not in distress, not febrile, not in distress. Safe for discharge home, OTC NSAIDs for fever and myalgias, maintain fluid and PO intake.   Patient discussed with Dr. Clayborne Dana.  Discussed results, findings, treatment and follow up. Patient advised of return precautions. Patient verbalized understanding and agreed with plan.   Final Clinical Impressions(s) / ED Diagnoses   Final diagnoses:  Flu-like symptoms  Viral illness    New Prescriptions New Prescriptions   No medications on file     Kevin Nicole Russo, PA-C 07/03/16 1823    Kevin Memos, MD 07/03/16 [phone]

## 2016-07-03 NOTE — ED Triage Notes (Signed)
Pt sts body aches and nasal congestion x 3 days

## 2017-06-02 ENCOUNTER — Emergency Department (HOSPITAL_BASED_OUTPATIENT_CLINIC_OR_DEPARTMENT_OTHER)
Admission: EM | Admit: 2017-06-02 | Discharge: 2017-06-02 | Disposition: A | Discharge status: Home / Self Care | Payer: Self-pay | Attending: Emergency Medicine | Admitting: Emergency Medicine

## 2017-06-02 ENCOUNTER — Other Ambulatory Visit: Payer: Self-pay

## 2017-06-02 ENCOUNTER — Encounter (HOSPITAL_BASED_OUTPATIENT_CLINIC_OR_DEPARTMENT_OTHER): Payer: Self-pay | Admitting: Emergency Medicine

## 2017-06-02 DIAGNOSIS — F17210 Nicotine dependence, cigarettes, uncomplicated: Secondary | ICD-10-CM | POA: Insufficient documentation

## 2017-06-02 DIAGNOSIS — J45909 Unspecified asthma, uncomplicated: Secondary | ICD-10-CM | POA: Insufficient documentation

## 2017-06-02 DIAGNOSIS — M545 Low back pain, unspecified: Secondary | ICD-10-CM

## 2017-06-02 DIAGNOSIS — R1110 Vomiting, unspecified: Secondary | ICD-10-CM | POA: Insufficient documentation

## 2017-06-02 DIAGNOSIS — R1111 Vomiting without nausea: Secondary | ICD-10-CM

## 2017-06-02 DIAGNOSIS — M791 Myalgia, unspecified site: Secondary | ICD-10-CM

## 2017-06-02 LAB — COMPREHENSIVE METABOLIC PANEL
ALT: 21 U/L (ref 17–63)
AST: 18 U/L (ref 15–41)
Albumin: 4.4 g/dL (ref 3.5–5.0)
Alkaline Phosphatase: 89 U/L (ref 38–126)
Anion gap: 10 (ref 5–15)
BUN: 9 mg/dL (ref 6–20)
CHLORIDE: 105 mmol/L (ref 101–111)
CO2: 22 mmol/L (ref 22–32)
Calcium: 9.2 mg/dL (ref 8.9–10.3)
Creatinine, Ser: 0.73 mg/dL (ref 0.61–1.24)
GFR calc Af Amer: >60 mL/min (ref >60)
GFR calc non Af Amer: >60 mL/min (ref >60)
Glucose, Bld: 101 mg/dL — ABNORMAL HIGH (ref 65–99)
POTASSIUM: 3.7 mmol/L (ref 3.5–5.1)
SODIUM: 137 mmol/L (ref 135–145)
Total Bilirubin: 0.9 mg/dL (ref 0.3–1.2)
Total Protein: 7.8 g/dL (ref 6.5–8.1)

## 2017-06-02 LAB — URINALYSIS, ROUTINE W REFLEX MICROSCOPIC
BILIRUBIN URINE: NEGATIVE
Glucose, UA: NEGATIVE mg/dL
Hgb urine dipstick: NEGATIVE
Ketones, ur: 15 mg/dL — AB
LEUKOCYTES UA: NEGATIVE
NITRITE: NEGATIVE
PH: 5.5 (ref 5.0–8.0)
Protein, ur: NEGATIVE mg/dL

## 2017-06-02 LAB — CBC WITH DIFFERENTIAL/PLATELET
Basophils Absolute: 0.1 10*3/uL (ref 0.0–0.1)
Basophils Relative: 1 %
EOS ABS: 0.1 10*3/uL (ref 0.0–0.7)
Eosinophils Relative: 1 %
HEMATOCRIT: 44.5 % (ref 39.0–52.0)
HEMOGLOBIN: 15.4 g/dL (ref 13.0–17.0)
LYMPHS ABS: 1.8 10*3/uL (ref 0.7–4.0)
LYMPHS PCT: 18 %
MCH: 29.4 pg (ref 26.0–34.0)
MCHC: 34.6 g/dL (ref 30.0–36.0)
MCV: 84.9 fL (ref 78.0–100.0)
Monocytes Absolute: 0.9 10*3/uL (ref 0.1–1.0)
Monocytes Relative: 8 %
NEUTROS ABS: 7.5 10*3/uL (ref 1.7–7.7)
NEUTROS PCT: 72 %
Platelets: 245 10*3/uL (ref 150–400)
RBC: 5.24 MIL/uL (ref 4.22–5.81)
RDW: 13 % (ref 11.5–15.5)
WBC: 10.3 10*3/uL (ref 4.0–10.5)

## 2017-06-02 LAB — LIPASE, BLOOD: LIPASE: 22 U/L (ref 11–51)

## 2017-06-02 MED ORDER — NAPROXEN 500 MG PO TABS: 500.0000 mg | ORAL_TABLET | Freq: Two times a day (BID) | ORAL | 0 refills | Status: DC

## 2017-06-02 MED ORDER — ONDANSETRON 4 MG PO TBDP: 4.0000 mg | ORAL_TABLET | Freq: Three times a day (TID) | ORAL | 0 refills | Status: DC | PRN

## 2017-06-02 MED ORDER — METHOCARBAMOL 500 MG PO TABS: 500.0000 mg | ORAL_TABLET | Freq: Every evening | ORAL | 0 refills | Status: DC | PRN

## 2017-06-02 MED ORDER — ACETAMINOPHEN 325 MG PO TABS
650.0000 mg | ORAL_TABLET | Freq: Once | ORAL | Status: AC
  Administered 2017-06-02: 650 mg via ORAL
  Filled 2017-06-02: qty 2

## 2017-06-02 MED ORDER — OXYCODONE HCL 5 MG PO TABS
5.0000 mg | ORAL_TABLET | Freq: Once | ORAL | Status: AC
  Administered 2017-06-02: 5 mg via ORAL
  Filled 2017-06-02: qty 1

## 2017-06-02 NOTE — Discharge Instructions (Signed)
As discussed, make sure that you stay well-hydrated drinking enough fluids to keep your urine clear.  Take Zofran as needed for nausea vomiting and start reintroducing food slowly. Take naproxen twice a day with food. The medication prescribed is to help with muscle spasm but should not be taken if driving or operating machinery.  Take at nighttime as needed.  Warm compresses to your lower back can also help with muscle spasm.  Follow-up with your primary care provider or the wellness center to establish a primary care relationship. Return if symptoms worsen or new concerning symptoms in the meantime.

## 2017-06-02 NOTE — ED Notes (Signed)
ED Provider at bedside. 

## 2017-06-02 NOTE — ED Provider Notes (Signed)
[facility] EMERGENCY DEPARTMENT Provider Note   CSN: [id number] Arrival date & time: 06/02/17  1220     History   Chief Complaint Chief Complaint  Patient presents with  . Emesis    HPI Manuelito Poage is a 27 y.o. male with past medical history significant for asthma and hep C presenting with 2 weeks of night sweats, 1 week of vomiting every morning without nausea, 2 days of bilateral leg ache and lower back pain. He has tried Tylenol and ibuprofen which has helped.  Denies any fever, chills, nausea, loss of bowel bladder function, numbness or tingling, urinary symptoms or abdominal pain. He denies any ill contacts. Endorses marijuana use no alcohol or other drugs.  Denies any IV drug use.  HPI  Past Medical History:  Diagnosis Date  . Asthma     There are no active problems to display for this patient.   History reviewed. No pertinent surgical history.     Home Medications    Prior to Admission medications   Medication Sig Start Date End Date Taking? Authorizing Provider  guaiFENesin (ROBITUSSIN) 100 MG/5ML liquid Take 5-10 mLs (100-200 mg total) by mouth every 4 (four) hours as needed for cough. 04/25/13   Janne Napoleon, NP  HYDROcodone-acetaminophen (NORCO) 5-325 MG per tablet Take 1 tablet by mouth every 6 (six) hours as needed. 04/25/13   Janne Napoleon, NP  methocarbamol (ROBAXIN) 500 MG tablet Take 1 tablet (500 mg total) by mouth at bedtime as needed. 06/02/17   Mathews Robinsons B, PA-C  naproxen (NAPROSYN) 500 MG tablet Take 1 tablet (500 mg total) by mouth 2 (two) times daily. 06/02/17   Mathews Robinsons B, PA-C  ondansetron (ZOFRAN ODT) 4 MG disintegrating tablet Take 1 tablet (4 mg total) by mouth every 8 (eight) hours as needed for nausea or vomiting. 06/02/17   Georgiana Shore PA-C    Family History History reviewed. No pertinent family history.  Social History Social History   Tobacco Use  . Smoking status: Current Every Day Smoker   Packs/day: 0.50    Types: Cigarettes  . Smokeless tobacco: Never Used  Substance Use Topics  . Alcohol use: No  . Drug use: Yes    Types: Marijuana     Allergies   Penicillins and Sulfa antibiotics   Review of Systems Review of Systems  Constitutional: Negative for chills, diaphoresis and fever.       Night sweats  Respiratory: Negative for cough, choking, chest tightness, shortness of breath, wheezing and stridor.   Cardiovascular: Negative for chest pain, palpitations and leg swelling.  Gastrointestinal: Positive for vomiting. Negative for abdominal distention, abdominal pain, blood in stool, diarrhea and nausea.  Genitourinary: Negative for difficulty urinating, dysuria, flank pain, frequency and hematuria.  Musculoskeletal: Positive for back pain and myalgias. Negative for arthralgias, gait problem, joint swelling, neck pain and neck stiffness.  Skin: Negative for color change, pallor and rash.  Neurological: Negative for dizziness, weakness, numbness and headaches.     Physical Exam Updated Vital Signs BP (!) 126/58 (BP Location: Right Arm)   Pulse 94   Temp 100.2 F (37.9 C) (Oral)   Resp 16   Ht 5\' 11"  (1.803 m)   Wt 83.9 kg (185 lb)   SpO2 100%   BMI 25.80 kg/m   Physical Exam  Constitutional: He is oriented to person, place, and time. He appears well-developed and well-nourished. No distress.  Low-grade fever, well-appearing, nontoxic lying comfortably in bed no acute  distress.  HENT:  Head: Normocephalic and atraumatic.  Eyes: Conjunctivae are normal.  Neck: Normal range of motion. Neck supple.  Cardiovascular: Normal rate, regular rhythm, normal heart sounds and intact distal pulses.  No murmur heard. Pulmonary/Chest: Effort normal and breath sounds normal. No stridor. No respiratory distress. He has no wheezes. He has no rales.  Abdominal: Soft. He exhibits no distension and no mass. There is no tenderness. There is no guarding.  Musculoskeletal: Normal  range of motion. He exhibits no edema, tenderness or deformity.  Full range of motion, no lower extremity edema, no tenderness palpation of the lower extremities.  No midline tenderness palpation of the spine.  Tightness and sore to palpation of the lower back musculature bilaterally  Neurological: He is alert and oriented to person, place, and time. No sensory deficit. He exhibits normal muscle tone.  Normal stance and gait.  5/5 strength to lower extremities bilaterally. Strong dorsalis pedis pulses.  Neurovascularly intact.  Skin: Skin is warm and dry. No rash noted. He is not diaphoretic. No erythema. No pallor.  Psychiatric: He has a normal mood and affect.  Nursing note and vitals reviewed.    ED Treatments / Results  Labs (all labs ordered are listed, but only abnormal results are displayed) Labs Reviewed  COMPREHENSIVE METABOLIC PANEL - Abnormal; Notable for the following components:      Result Value   Glucose, Bld 101 (*)    All other components within normal limits  URINALYSIS, ROUTINE W REFLEX MICROSCOPIC - Abnormal; Notable for the following components:   Specific Gravity, Urine >1.030 (*)    Ketones, ur 15 (*)    All other components within normal limits  LIPASE, BLOOD  CBC WITH DIFFERENTIAL/PLATELET    EKG  EKG Interpretation None       Radiology No results found.  Procedures Procedures (including critical care time)  Medications Ordered in ED Medications  oxyCODONE (Oxy IR/ROXICODONE) immediate release tablet 5 mg (5 mg Oral Given 06/02/17 1617)  acetaminophen (TYLENOL) tablet 650 mg (650 mg Oral Given 06/02/17 1805)     Initial Impression / Assessment and Plan / ED Course  I have reviewed the triage vital signs and the nursing notes.  Pertinent labs & imaging results that were available during my care of the patient were reviewed by me and considered in my medical decision making (see chart for details).    Patient presents with multiple chronic  complaints and 2 days of bilateral lower extremity achiness mainly in the thighs. Low back pain bilaterally without midline tenderness or red flags.  Patient presents with lower back pain.  No gross neurological deficits and normal neuro exam.  Patient has no gait abnormality or concern for cauda equina.  No loss of bowel or bladder control, fever, night sweats, weight loss, h/o malignancy, or IVDU.  RICE protocol and pain medications indicated and discussed with patient.   Labs are unremarkable  On exam he is well-appearing nontoxic.  Abdomen soft and nontender to palpation.  Pain was managed while in the emergency department.  No  nausea vomiting. He is tolerating PO.  Symptoms could be due to viral illness, no emergent cause to patient's symptoms suspected. Reassuring exam and workup.   Discharge home with symptomatic relief and close follow-up with PCP.  Advised that marijuana can cause vomiting.  Urged patient to stay well-hydrated.  Discussed strict return precautions and advised to return to the emergency department if experiencing any new or worsening symptoms. Instructions were understood  and patient agreed with discharge plan.  Final Clinical Impressions(s) / ED Diagnoses   Final diagnoses:  Non-intractable vomiting without nausea, unspecified vomiting type  Myalgia  Acute bilateral low back pain without sciatica    ED Discharge Orders        Ordered    methocarbamol (ROBAXIN) 500 MG tablet  At bedtime PRN     06/02/17 1841    naproxen (NAPROSYN) 500 MG tablet  2 times daily     06/02/17 1841    ondansetron (ZOFRAN ODT) 4 MG disintegrating tablet  Every 8 hours PRN     06/02/17 1841       Georgiana ShoreMitchell, Eilan Mcinerny B, PA-C 06/02/17 2117    Pricilla LovelessGoldston, Scott, MD 06/03/17 1441

## 2017-06-02 NOTE — ED Notes (Signed)
Pt status discussed with Dr. Criss AlvineGoldston and pain medication given per order

## 2017-06-02 NOTE — ED Triage Notes (Addendum)
Patient is here with family member who is doing most of the talking. The patient states that he is having Night sweats x 2 weeks . Patient denies any recent weight gain. Family states that the patient is weak and has loose stools and has the vomiting in the morning that is yellow in the am . The patient also is having "bloody vomit" starting this am. Patient has had recent tooth ache and other illnesses and they are concerned that he is having continued problems. The patient also reports that he is having lower back pain "all the way down" to his feet

## 2017-11-01 ENCOUNTER — Encounter (HOSPITAL_BASED_OUTPATIENT_CLINIC_OR_DEPARTMENT_OTHER): Payer: Self-pay | Admitting: *Deleted

## 2017-11-01 ENCOUNTER — Other Ambulatory Visit: Payer: Self-pay

## 2017-11-01 ENCOUNTER — Emergency Department (HOSPITAL_BASED_OUTPATIENT_CLINIC_OR_DEPARTMENT_OTHER)
Admission: EM | Admit: 2017-11-01 | Discharge: 2017-11-01 | Disposition: A | Discharge status: Home / Self Care | Payer: Self-pay | Attending: Emergency Medicine | Admitting: Emergency Medicine

## 2017-11-01 DIAGNOSIS — J45909 Unspecified asthma, uncomplicated: Secondary | ICD-10-CM | POA: Insufficient documentation

## 2017-11-01 DIAGNOSIS — F17210 Nicotine dependence, cigarettes, uncomplicated: Secondary | ICD-10-CM | POA: Insufficient documentation

## 2017-11-01 DIAGNOSIS — Z79899 Other long term (current) drug therapy: Secondary | ICD-10-CM | POA: Insufficient documentation

## 2017-11-01 DIAGNOSIS — R599 Enlarged lymph nodes, unspecified: Secondary | ICD-10-CM

## 2017-11-01 DIAGNOSIS — R590 Localized enlarged lymph nodes: Secondary | ICD-10-CM | POA: Insufficient documentation

## 2017-11-01 MED ORDER — DOXYCYCLINE HYCLATE 100 MG PO CAPS: 100.0000 mg | ORAL_CAPSULE | Freq: Two times a day (BID) | ORAL | 0 refills | Status: DC

## 2017-11-01 MED FILL — DOXYCYCLINE HYCLATE 100 MG: 100 | 7 days supply | Qty: 14 | Fill #0

## 2017-11-01 NOTE — ED Provider Notes (Signed)
[facility] EMERGENCY DEPARTMENT Provider Note   CSN: [id number] Arrival date & time: 11/01/17  1437     History   Chief Complaint Chief Complaint  Patient presents with  . Neck " knot"    HPI Kevin Mack is a 27 y.o. male.  27 yo M with a chief complaint of swelling just behind the angle of his right jaw.  This been going on for the past couple months.  Started right after he had a dental infection.  He thinks is getting mildly worse.  He has been having night sweats, that is been going on for quite some time.  He denies any weight loss denies ongoing IV drug abuse.  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  He denies fevers or chills.  Denies history of HIV.  Denies risky sexual contact.  He has found a couple different ticks on him though did not feel that they are engorged.  Denies rash.  The history is provided by the patient.  Illness  The current episode started more than 1 week ago. The problem occurs constantly. The problem has been gradually worsening. Pertinent negatives include no chest pain, no abdominal pain, no headaches and no shortness of breath. Nothing aggravates the symptoms. Nothing relieves the symptoms. He has tried nothing for the symptoms. The treatment provided no relief.    Past Medical History:  Diagnosis Date  . Asthma     There are no active problems to display for this patient.   History reviewed. No pertinent surgical history.      Home Medications    Prior to Admission medications   Medication Sig Start Date End Date Taking? Authorizing Provider  doxycycline (VIBRAMYCIN) 100 MG capsule Take 1 capsule (100 mg total) by mouth 2 (two) times daily. One po bid x 7 days 11/01/17   Melene Plan, DO  naproxen (NAPROSYN) 500 MG tablet Take 1 tablet (500 mg total) by mouth 2 (two) times daily. 06/02/17   Mathews Robinsons B, PA-C  ondansetron (ZOFRAN ODT) 4 MG disintegrating tablet Take 1 tablet (4 mg  total) by mouth every 8 (eight) hours as needed for nausea or vomiting. 06/02/17   Georgiana Shore PA-C    Family History History reviewed. No pertinent family history.  Social History Social History   Tobacco Use  . Smoking status: Current Every Day Smoker    Packs/day: 0.50    Types: Cigarettes  . Smokeless tobacco: Never Used  Substance Use Topics  . Alcohol use: No  . Drug use: Yes    Types: Marijuana     Allergies   Penicillins and Sulfa antibiotics   Review of Systems Review of Systems  Constitutional: Negative for chills and fever.  HENT: Negative for congestion and facial swelling.        Swollen lymph node   Eyes: Negative for discharge and visual disturbance.  Respiratory: Negative for shortness of breath.   Cardiovascular: Negative for chest pain and palpitations.  Gastrointestinal: Negative for abdominal pain, diarrhea and vomiting.  Musculoskeletal: Negative for arthralgias and myalgias.  Skin: Negative for color change and rash.  Neurological: Negative for tremors, syncope and headaches.  Psychiatric/Behavioral: Negative for confusion and dysphoric mood.     Physical Exam Updated Vital Signs BP (!) 164/99   Pulse 89   Temp 98.4 F (36.9 C) (Oral)   Resp 16   Ht 5\' 11"  (1.803 m)   Wt  81.6 kg (180 lb)   SpO2 100%   BMI 25.10 kg/m   Physical Exam  Constitutional: He is oriented to person, place, and time. He appears well-developed and well-nourished.  HENT:  Head: Normocephalic and atraumatic.  Very small not erythematous lymph node to the anterior cervical chain just underneath the right ear.  Right TM without infection.  Intraoral without infection.  Eyes: Pupils are equal, round, and reactive to light. EOM are normal.  Neck: Normal range of motion. Neck supple. No JVD present.  Cardiovascular: Normal rate and regular rhythm. Exam reveals no gallop and no friction rub.  No murmur heard. Pulmonary/Chest: No respiratory distress. He has  no wheezes.  Abdominal: He exhibits no distension. There is no rebound and no guarding.  Musculoskeletal: Normal range of motion.  Neurological: He is alert and oriented to person, place, and time.  Skin: No rash noted. No pallor.  Psychiatric: He has a normal mood and affect. His behavior is normal.  Nursing note and vitals reviewed.    ED Treatments / Results  Labs (all labs ordered are listed, but only abnormal results are displayed) Labs Reviewed - No data to display  EKG None  Radiology No results found.  Procedures Procedures (including critical care time)  Medications Ordered in ED Medications - No data to display   Initial Impression / Assessment and Plan / ED Course  I have reviewed the triage vital signs and the nursing notes.  Pertinent labs & imaging results that were available during my care of the patient were reviewed by me and considered in my medical decision making (see chart for details).     27 yo M with a chief complaint of lymph node swelling.  Most likely this is postinfectious as it started right after he had a dental infection.  The patient has a history of IV drug abuse though denies current ongoing use he has no murmur on my exam.  No fevers or chills I feel this is unlikely to be endocarditis.  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  We will treat for possible tickborne illness.  PCP follow-up.  3:53 PM:  I have discussed the diagnosis/risks/treatment options with the patient and family and believe the pt to be eligible for discharge home to follow-up with PCP. We also discussed returning to the ED immediately if new or worsening sx occur. We discussed the sx which are most concerning (e.g., sudden worsening pain, fever, inability to tolerate by mouth) that necessitate immediate return. Medications administered to the patient during their visit and any new prescriptions provided to the patient are listed below.  Medications given  during this visit Medications - No data to display    The patient appears reasonably screen and/or stabilized for discharge and I doubt any other medical condition or other [facility] requiring further screening, evaluation, or treatment in the ED at this time prior to discharge.    Final Clinical Impressions(s) / ED Diagnoses   Final diagnoses:  Lymph node enlargement    ED Discharge Orders        Ordered    doxycycline (VIBRAMYCIN) 100 MG capsule  2 times daily     11/01/17 1533       Melene PlanFloyd, Marvell Stavola, DO 11/01/17 1553

## 2017-11-01 NOTE — ED Notes (Signed)
ED Provider at bedside. 

## 2017-11-01 NOTE — ED Triage Notes (Signed)
Pt c/o " knot " to right side of neck x " months

## 2017-11-01 NOTE — Discharge Instructions (Signed)
Follow up with your PCP.  Return for worsening swelling, pain, fevers, chills.

## 2017-12-24 ENCOUNTER — Encounter (HOSPITAL_BASED_OUTPATIENT_CLINIC_OR_DEPARTMENT_OTHER): Payer: Self-pay | Admitting: Emergency Medicine

## 2017-12-24 ENCOUNTER — Emergency Department (HOSPITAL_BASED_OUTPATIENT_CLINIC_OR_DEPARTMENT_OTHER)
Admission: EM | Admit: 2017-12-24 | Discharge: 2017-12-24 | Disposition: A | Discharge status: Home / Self Care | Payer: Self-pay | Attending: Emergency Medicine | Admitting: Emergency Medicine

## 2017-12-24 ENCOUNTER — Other Ambulatory Visit: Payer: Self-pay

## 2017-12-24 DIAGNOSIS — R599 Enlarged lymph nodes, unspecified: Secondary | ICD-10-CM | POA: Insufficient documentation

## 2017-12-24 DIAGNOSIS — J45909 Unspecified asthma, uncomplicated: Secondary | ICD-10-CM | POA: Insufficient documentation

## 2017-12-24 DIAGNOSIS — Z79899 Other long term (current) drug therapy: Secondary | ICD-10-CM | POA: Insufficient documentation

## 2017-12-24 DIAGNOSIS — F17210 Nicotine dependence, cigarettes, uncomplicated: Secondary | ICD-10-CM | POA: Insufficient documentation

## 2017-12-24 LAB — CBC WITH DIFFERENTIAL/PLATELET
BASOS ABS: 0 10*3/uL (ref 0.0–0.1)
Basophils Relative: 0 %
EOS PCT: 1 %
Eosinophils Absolute: 0.1 10*3/uL (ref 0.0–0.7)
HEMATOCRIT: 44.3 % (ref 39.0–52.0)
HEMOGLOBIN: 15.5 g/dL (ref 13.0–17.0)
LYMPHS PCT: 30 %
Lymphs Abs: 2.5 10*3/uL (ref 0.7–4.0)
MCH: 29.8 pg (ref 26.0–34.0)
MCHC: 35 g/dL (ref 30.0–36.0)
MCV: 85.2 fL (ref 78.0–100.0)
Monocytes Absolute: 0.6 10*3/uL (ref 0.1–1.0)
Monocytes Relative: 8 %
NEUTROS ABS: 4.9 10*3/uL (ref 1.7–7.7)
NEUTROS PCT: 61 %
Platelets: 243 10*3/uL (ref 150–400)
RBC: 5.2 MIL/uL (ref 4.22–5.81)
RDW: 12.8 % (ref 11.5–15.5)
WBC: 8.1 10*3/uL (ref 4.0–10.5)

## 2017-12-24 LAB — COMPREHENSIVE METABOLIC PANEL
ALK PHOS: 79 U/L (ref 38–126)
ALT: 21 U/L (ref 0–44)
AST: 20 U/L (ref 15–41)
Albumin: 4.5 g/dL (ref 3.5–5.0)
Anion gap: 10 (ref 5–15)
BUN: 16 mg/dL (ref 6–20)
CHLORIDE: 107 mmol/L (ref 98–111)
CO2: 23 mmol/L (ref 22–32)
Calcium: 9.2 mg/dL (ref 8.9–10.3)
Creatinine, Ser: 0.93 mg/dL (ref 0.61–1.24)
GFR calc Af Amer: >60 mL/min (ref >60)
Glucose, Bld: 93 mg/dL (ref 70–99)
Potassium: 3.8 mmol/L (ref 3.5–5.1)
Sodium: 140 mmol/L (ref 135–145)
Total Bilirubin: 1.6 mg/dL — ABNORMAL HIGH (ref 0.3–1.2)
Total Protein: 7.3 g/dL (ref 6.5–8.1)

## 2017-12-24 MED ORDER — METOCLOPRAMIDE HCL 5 MG/ML IJ SOLN
10.0000 mg | Freq: Once | INTRAMUSCULAR | Status: AC
  Administered 2017-12-24: 10 mg via INTRAVENOUS
  Filled 2017-12-24: qty 2

## 2017-12-24 MED ORDER — SODIUM CHLORIDE 0.9 % IV BOLUS
1000.0000 mL | Freq: Once | INTRAVENOUS | Status: AC
  Administered 2017-12-24: 1000 mL via INTRAVENOUS

## 2017-12-24 MED ORDER — DIPHENHYDRAMINE HCL 50 MG/ML IJ SOLN
25.0000 mg | Freq: Once | INTRAMUSCULAR | Status: AC
  Administered 2017-12-24: 25 mg via INTRAVENOUS
  Filled 2017-12-24: qty 1

## 2017-12-24 MED ORDER — KETOROLAC TROMETHAMINE 30 MG/ML IJ SOLN
30.0000 mg | Freq: Once | INTRAMUSCULAR | Status: AC
Start: 2017-12-24 — End: 2017-12-24
  Administered 2017-12-24: 30 mg via INTRAVENOUS
  Filled 2017-12-24: qty 1

## 2017-12-24 MED ORDER — IBUPROFEN 800 MG PO TABS
800.0000 mg | ORAL_TABLET | Freq: Once | ORAL | Status: AC
  Administered 2017-12-24: 800 mg via ORAL
  Filled 2017-12-24: qty 1

## 2017-12-24 NOTE — Discharge Instructions (Addendum)
Your blood counts, electrolytes, kidney function and liver function were all normal today. The HIV and syphilis tests will not come back today. You will receive a call from [place] in 1-2 days with the results.  It is very important that you follow-up with an ENT doctor soon to discuss this lymph node and further evaluation necessary.  You will receive a call from case management to help set you up with a primary care provider as well.

## 2017-12-24 NOTE — ED Triage Notes (Signed)
Pt reports swollen lymph nodes to R jaw area. Seen here 1 month ago and tx with doxycycline for possible tick exposure. Pt reports it is still painful and swollen.

## 2017-12-24 NOTE — ED Notes (Signed)
ED Provider at bedside. 

## 2017-12-24 NOTE — ED Provider Notes (Signed)
[facility] EMERGENCY DEPARTMENT Provider Note  CSN: [id number] Arrival date & time: 12/24/17  1516    History   Chief Complaint Chief Complaint  Patient presents with  . Swollen lymph node    HPI Kevin Mack is a 27 y.o. male with a medical history of asthma, Hepatitis C and IVDU who presented to the ED for right sided neck pain x6 months. Patient describes aching pain and tightness on his neck underneath his . He states that pain is worse with movement, but no specific relieving symptoms. He states that this began acutely after twisting his head in an awkward position and states he "felt a pop" at that time. Denies fever, decreased neck ROM, throat pain, dysphagia, skin rashes or AMS.   Patient states that he was told he had a swollen lymph node which was causing this pain in his neck. He endorses the following symptoms in this time frame: fatigue, decreased appetite, myalgias and night sweats. Denies weight loss, but states that others have told him that he looks like he has. He has not sought medical evaluation specifically for the lymph node. Denies other swollen areas (such as armpits, groin or abdomen), chest pain, palpitations, skin color changes, easy bleeding or bruising.  Additional history obtained by medical chart. Patient seen in the ED in 10/2017 for the same complaint. Discharged with recommended PCP follow-up.   Past Medical History:  Diagnosis Date  . Asthma     There are no active problems to display for this patient.   History reviewed. No pertinent surgical history.      Home Medications    Prior to Admission medications   Medication Sig Start Date End Date Taking? Authorizing Provider  doxycycline (VIBRAMYCIN) 100 MG capsule Take 1 capsule (100 mg total) by mouth 2 (two) times Mack. One po bid x 7 days 11/01/17   Melene Plan, DO  naproxen (NAPROSYN) 500 MG tablet Take 1 tablet (500 mg total) by mouth 2 (two) times Mack. 06/02/17   Mathews Robinsons B, PA-C  ondansetron (ZOFRAN ODT) 4 MG disintegrating tablet Take 1 tablet (4 mg total) by mouth every 8 (eight) hours as needed for nausea or vomiting. 06/02/17   Georgiana Shore, PA-C    Family History No family history on file.  Social History Social History   Tobacco Use  . Smoking status: Current Every Day Smoker    Packs/day: 0.50    Types: Cigarettes  . Smokeless tobacco: Never Used  Substance Use Topics  . Alcohol use: No  . Drug use: Yes    Types: Marijuana     Allergies   Penicillins and Sulfa antibiotics   Review of Systems Review of Systems  Constitutional: Positive for appetite change, chills and fatigue. Negative for diaphoresis, fever and unexpected weight change.  HENT: Negative for sore throat and trouble swallowing.   Eyes: Negative for pain and visual disturbance.  Respiratory: Negative for chest tightness and shortness of breath.   Cardiovascular: Negative for chest pain and palpitations.  Gastrointestinal: Negative for abdominal distention, abdominal pain, constipation, diarrhea, nausea and vomiting.  Genitourinary: Negative.   Musculoskeletal: Positive for myalgias and neck pain. Negative for arthralgias and neck stiffness.  Skin: Negative for color change and rash.  Neurological: Positive for headaches. Negative for dizziness, weakness, light-headedness and numbness.  Hematological: Positive for adenopathy. Does not bruise/bleed easily.   Physical Exam Updated Vital Signs BP 128/90   Pulse 63   Temp 98.3 F (36.8 C) (Oral)  Resp 18   Ht 5\' 11"  (1.803 m)   Wt 79.4 kg   SpO2 100%   BMI 24.41 kg/m   Physical Exam  Constitutional: Vital signs are normal. He appears well-developed and well-nourished. He is sleeping and cooperative. No distress.  HENT:  Head: Normocephalic and atraumatic.  Right Ear: Tympanic membrane and ear canal normal.  Left Ear: Tympanic membrane and ear canal normal.  Mouth/Throat: Uvula is midline,  oropharynx is clear and moist and mucous membranes are normal. No oral lesions. No trismus in the jaw.  Eyes: Pupils are equal, round, and reactive to light. Conjunctivae, EOM and lids are normal. No scleral icterus.  Neck: Trachea normal, normal range of motion, full passive range of motion without pain and phonation normal. Neck supple. No spinous process tenderness and no muscular tenderness present. Normal range of motion present.  Cardiovascular: Normal rate, regular rhythm, intact distal pulses and normal pulses.  No murmur heard. Pulmonary/Chest: Effort normal and breath sounds normal.  Abdominal: Soft. Normal appearance and bowel sounds are normal. There is no tenderness.  Musculoskeletal: Normal range of motion.  Lymphadenopathy:       Head (right side): No submental, no submandibular, no tonsillar, no preauricular, no posterior auricular and no occipital adenopathy present.       Head (left side): No submental, no submandibular, no tonsillar, no preauricular, no posterior auricular and no occipital adenopathy present.    He has no cervical adenopathy.       Right cervical: No superficial cervical, no deep cervical and no posterior cervical adenopathy present.      Left cervical: No superficial cervical, no deep cervical and no posterior cervical adenopathy present.    He has no axillary adenopathy.  Neurological: He is alert. He has normal strength. No cranial nerve deficit or sensory deficit. He exhibits normal muscle tone.  Nursing note and vitals reviewed.  ED Treatments / Results  Labs (all labs ordered are listed, but only abnormal results are displayed) Labs Reviewed  COMPREHENSIVE METABOLIC PANEL - Abnormal; Notable for the following components:      Result Value   Total Bilirubin 1.6 (*)    All other components within normal limits  CBC WITH DIFFERENTIAL/PLATELET  HIV ANTIBODY (ROUTINE TESTING)  RPR    EKG None  Radiology No results  found.  Procedures Procedures (including critical care time)  Medications Ordered in ED Medications  sodium chloride 0.9 % bolus 1,000 mL (1,000 mLs Intravenous New Bag/Given 12/24/17 1736)  ibuprofen (ADVIL,MOTRIN) tablet 800 mg (800 mg Oral Given 12/24/17 1631)  metoCLOPramide (REGLAN) injection 10 mg (10 mg Intravenous Given 12/24/17 1737)  ketorolac (TORADOL) 30 MG/ML injection 30 mg (30 mg Intravenous Given 12/24/17 1736)  diphenhydrAMINE (BENADRYL) injection 25 mg (25 mg Intravenous Given 12/24/17 1737)     Initial Impression / Assessment and Plan / ED Course  Triage vital signs and the nursing notes have been reviewed.  Pertinent labs & imaging results that were available during care of the patient were reviewed and considered in medical decision making (see chart for details).  Patient presents afebrile and well appearing with complaints of right cervical adenopathy. He states this has been present for 6 months and is unchanged in size, but complains of constitutional symptoms of fatigue, decreased appetite and myalgias. However, he denies s/s that would suggest a specific infectious etiology to these complaints. Given his history of IVDU and Hepatitis C, there is concern for HIV. Also concerned about a hematologic disorder like lymphoma  or leukemia. Physical exam today is unremarkable including no adenopathy present.  Clinical Course as of Dec 25 1834  Tue Dec 24, 2017  1709 Labs unremarkable which is reassuring to rule out a hematologic cause to his swollen lymph node such as lymphoma or leukemia. HIV/RPR will not result today. Will connect patient with case management to establish care with a PCP. Will also refer to ENT for further evaluation and possible biopsy. Case discussed with Dr. Gwyneth Sprout.   [GM]    Clinical Course User Index [GM] Mortis, Sharyon Medicus, PA-C    Final Clinical Impressions(s) / ED Diagnoses  1. Lymph Node Enlargement. Referral to case management to  connect with PCP as follow-up is very important in this case. Referral to ENT for further evaluation. Education provided on s/s that warrant return to the ED.  Dispo: Home. After thorough clinical evaluation, this patient is determined to be medically stable and can be safely discharged with the previously mentioned treatment and/or outpatient follow-up/referral(s). At this time, there are no other apparent medical conditions that require further screening, evaluation or treatment.   Final diagnoses:  Lymph node enlargement    ED Discharge Orders         Ordered    Ambulatory referral to ENT     12/24/17 1815            Reva Bores 12/24/17 1836    Gwyneth Sprout, MD 12/24/17 2356

## 2017-12-24 NOTE — ED Notes (Signed)
Pt reports night sweats x 5 months, increased fatigue and irritability. Pt finished course of abx given a month ago. Denies any increased swelling to lymph node but reports it bother him when he turns his head side to side.

## 2017-12-25 LAB — RPR: RPR: NONREACTIVE

## 2017-12-25 LAB — HIV ANTIBODY (ROUTINE TESTING W REFLEX): HIV SCREEN 4TH GENERATION: NONREACTIVE

## 2017-12-25 NOTE — Care Management Note (Addendum)
Case Management Note  Patient Details  Name: Kevin Mack MRN: [id number] Date of Birth: 10-21-1990  CM consulted for no pcp and no ins with need for immediate follow up.  CM noted pt was given an ambulatory ENT referral but no PCP clinic information.  CM sent a message as a high priority to [facility] CM for possible appointment cancellation openings and his liekly need for financial counseling ASAP for specialty follow up.  Updated [place], PA via messages.  No further CM needs noted at this time.  Jailen Coward, Lynnae Sandhoff, RN 12/25/2017, 9:15 AM

## 2017-12-30 ENCOUNTER — Telehealth: Payer: Self-pay

## 2017-12-30 NOTE — Telephone Encounter (Signed)
Message received from Eldridge AbrahamsAngela Kritzer, RN CM requesting a hospital follow up appointment for the patient at [facility].  This CM spoke to the patient today and scheduled him for the first available appointment  - 01/08/18 @ 1100. He was informed that he can call the clinic at any time to check for cancellations for an earlier appointment. He said that he had the phone #.  He also said that he went to ENT " the other day" and was told that the lymph node " ain't swollen" but he is tired of feeling sick and has been feeling this way for months. He was appreciative of the appointment.   Update provided to Breck CoonsA. Kritzer, RN CM

## 2017-12-31 ENCOUNTER — Other Ambulatory Visit: Payer: Self-pay | Admitting: Otolaryngology

## 2017-12-31 DIAGNOSIS — R590 Localized enlarged lymph nodes: Secondary | ICD-10-CM

## 2018-01-03 ENCOUNTER — Other Ambulatory Visit: Payer: Self-pay

## 2018-01-08 ENCOUNTER — Ambulatory Visit: Payer: Self-pay | Attending: Family Medicine | Admitting: Physician Assistant

## 2018-01-08 VITALS — BP 128/80 | HR 62 | Temp 98.0°F | Resp 17 | Wt 180.6 lb

## 2018-01-08 DIAGNOSIS — R51 Headache: Secondary | ICD-10-CM | POA: Insufficient documentation

## 2018-01-08 DIAGNOSIS — M549 Dorsalgia, unspecified: Secondary | ICD-10-CM

## 2018-01-08 DIAGNOSIS — R590 Localized enlarged lymph nodes: Secondary | ICD-10-CM

## 2018-01-08 DIAGNOSIS — F17210 Nicotine dependence, cigarettes, uncomplicated: Secondary | ICD-10-CM | POA: Insufficient documentation

## 2018-01-08 DIAGNOSIS — Z880 Allergy status to penicillin: Secondary | ICD-10-CM | POA: Insufficient documentation

## 2018-01-08 DIAGNOSIS — B1920 Unspecified viral hepatitis C without hepatic coma: Secondary | ICD-10-CM

## 2018-01-08 DIAGNOSIS — R112 Nausea with vomiting, unspecified: Secondary | ICD-10-CM

## 2018-01-08 DIAGNOSIS — Z882 Allergy status to sulfonamides status: Secondary | ICD-10-CM | POA: Insufficient documentation

## 2018-01-08 MED ORDER — ONDANSETRON HCL 4 MG PO TABS: 4.0000 mg | ORAL_TABLET | Freq: Three times a day (TID) | ORAL | 0 refills | Status: DC | PRN

## 2018-01-08 MED ORDER — LIDOCAINE 5 % EX PTCH: 1.0000 | MEDICATED_PATCH | CUTANEOUS | 0 refills | Status: DC

## 2018-01-08 NOTE — Progress Notes (Signed)
Kevin Mack  ZOX:[id number]  [id number]:[id number]  DOB - November 06, 1990  Chief Complaint  Patient presents with  . Hospitalization Follow-up    ED f/up. was seen at [facility] ED on 9/10 for R sided swollen lymph nodes. self referral to ENT was seen on 9/13 by [facility] ENT. states that ENT wanted to do an U/S but he couldn't afford to have the imaging done  . Headache    has had daily headaches & vomiting since he felt the pop in his neck after coughing 7 months ago       Subjective:   Kevin Mack is a 27 y.o. male here today for establishment of care.  He has a past medical history of asthma and smoking.  About 2 years ago he went to donate plasma and was told that he had hepatitis C.  He did not have insurance at the time and never had any follow-up.  On 11/01/2017 he presented to the emergency department with pain behind his right ear.  He felt some swelling and a knot.  He feels like he recently had a dental infection and at some point he felt a pop in that location.  He went to the emergency department and was prescribed doxycycline for perhaps a potential tick bite.  His blood pressure was elevated on this day at 164/99.  On 12/24/2017 he went back to the emergency department with similar symptoms.  He still was having swelling in the right side of his neck.  He was having some pain when he would move his neck in different positions.  Any type of awkward movement will cause pain from his cervical spine down to the top of his bottom.  Again he is a hepatitis C positive.  Labs show no evidence of HIV.  His RPR was negative.  His BMP was okay as well as a CBC.  He did also mention some mild constitutional symptoms like fatigue, decreased appetite and myalgias.  He was referred to ENT and has been seen.  This caused him $100 out-of-pocket.  ENT was not too concerned about the lymphadenopathy.  They did order an ultrasound soft tissue of the head and neck but he was unable to afford this  imaging.  He complains of daily nausea and dry heaves but not necessarily any vomiting.  He continues to have this sharp back pain from the neck down to the lumbar spine.    ROS: GEN: denies fever or chills, denies change in weight Skin: denies lesions or rashes HEENT: + headache, no earache, epistaxis, sore throat, +neck pain LUNGS: denies SHOB, dyspnea, PND, orthopnea CV: denies CP or palpitations ABD: denies abd pain, N or V EXT: denies muscle spasms or swelling; no pain in lower ext, no weakness NEURO: denies numbness or tingling, denies sz, stroke or TIA  No problems updated.  ALLERGIES: Allergies  Allergen Reactions  . Penicillins   . Sulfa Antibiotics Other (See Comments)    Unknown    PAST MEDICAL HISTORY: Past Medical History:  Diagnosis Date  . Asthma     PAST SURGICAL HISTORY: History reviewed. No pertinent surgical history.  MEDICATIONS AT HOME: Prior to Admission medications   Not on File    Family-unknown Social-unmarried, smokes, works in Aeronautical engineer  Objective:   Vitals:   01/08/18 1122  BP: 128/80  Pulse: 62  Resp: 17  Temp: 98 F (36.7 C)  TempSrc: Oral  SpO2: 96%  Weight: 180 lb 9.6 oz (81.9 kg)  Exam-benign General appearance : Awake, alert, not in any distress. Speech Clear. Not toxic looking HEENT: Atraumatic and Normocephalic, pupils equally reactive to light and accomodation Neck: supple, no JVD. + right cervical lymphadenopathy.  Chest:Good air entry bilaterally, no added sounds  CVS: S1 S2 regular, no murmurs.  Extremities: B/L Lower Ext shows no edema, both legs are warm to touch Neurology: Awake alert, and oriented X 3, CN II-XII intact, Non focal Skin:No Rash    Assessment & Plan  1. Acute Cervical Back Pain w/ radiation 2. N/V 3. Hepatitis C 4. Cervical LAD  We will try a Lidoderm patch in regards to the pain in his back.  Trying to avoid Tylenol based products given his history of hepatitis.  Trying to avoid  anti-inflammatories given his recent stomach issues.  MRI of the cervical, thoracic and lumbar spine once he has insurance in place.  May need Ortho at some point. We will give him some Zofran tablets as needed and have encouraged omeprazole over-the-counter to take daily.  Consider referral for gastroenterology but will try to get the orange card in place first. ENT has no further recommendations.    Return in about 4 weeks (around 02/05/2018).  The patient was given clear instructions to go to ER or return to medical center if symptoms don't improve, worsen or new problems develop. The patient verbalized understanding. The patient was told to call to get lab results if they haven't heard anything in the next week.   Total time spent with patient was 37 min. Greater than 50 % of this visit was spent face to face counseling and coordinating care regarding risk factor modification, compliance importance and encouragement, education related to pain and insurance/referrals.  This note has been created with Education officer, environmental. Any transcriptional errors are unintentional.    Scot Jun, PA-C [facility] [place], [place] [phone]   01/08/2018, 11:44 AM

## 2018-02-04 ENCOUNTER — Encounter: Payer: Self-pay | Admitting: Family Medicine

## 2018-02-04 ENCOUNTER — Ambulatory Visit: Payer: Self-pay | Attending: Family Medicine | Admitting: Family Medicine

## 2018-02-04 VITALS — BP 132/78 | HR 92 | Temp 97.8°F | Resp 18 | Ht 70.0 in | Wt 182.4 lb

## 2018-02-04 DIAGNOSIS — R0602 Shortness of breath: Secondary | ICD-10-CM

## 2018-02-04 DIAGNOSIS — M546 Pain in thoracic spine: Secondary | ICD-10-CM

## 2018-02-04 DIAGNOSIS — Z880 Allergy status to penicillin: Secondary | ICD-10-CM | POA: Insufficient documentation

## 2018-02-04 DIAGNOSIS — Z79899 Other long term (current) drug therapy: Secondary | ICD-10-CM | POA: Insufficient documentation

## 2018-02-04 DIAGNOSIS — Z882 Allergy status to sulfonamides status: Secondary | ICD-10-CM | POA: Insufficient documentation

## 2018-02-04 DIAGNOSIS — M542 Cervicalgia: Secondary | ICD-10-CM

## 2018-02-04 DIAGNOSIS — M62838 Other muscle spasm: Secondary | ICD-10-CM

## 2018-02-04 DIAGNOSIS — Z7952 Long term (current) use of systemic steroids: Secondary | ICD-10-CM | POA: Insufficient documentation

## 2018-02-04 DIAGNOSIS — F17210 Nicotine dependence, cigarettes, uncomplicated: Secondary | ICD-10-CM | POA: Insufficient documentation

## 2018-02-04 DIAGNOSIS — B1920 Unspecified viral hepatitis C without hepatic coma: Secondary | ICD-10-CM

## 2018-02-04 DIAGNOSIS — G8929 Other chronic pain: Secondary | ICD-10-CM

## 2018-02-04 MED ORDER — ALBUTEROL SULFATE HFA 108 (90 BASE) MCG/ACT IN AERS: 2.0000 | INHALATION_SPRAY | Freq: Four times a day (QID) | RESPIRATORY_TRACT | 2 refills | Status: DC | PRN

## 2018-02-04 MED ORDER — PREDNISONE 20 MG PO TABS: ORAL_TABLET | ORAL | 0 refills | Status: DC

## 2018-02-04 MED ORDER — TIZANIDINE HCL 4 MG PO CAPS: ORAL_CAPSULE | ORAL | 1 refills | Status: DC

## 2018-02-04 NOTE — Patient Instructions (Signed)

## 2018-02-04 NOTE — Progress Notes (Signed)
Subjective:    Patient ID: Kevin Mack, male    DOB: 01/13/1991, 27 y.o.   MRN: [id number]  HPI 27 year old male who was last seen in the office on 01/08/2018 and seen by another provider secondary to complaint of acute cervical back pain.  Patient was prescribed Lidoderm patches.  Patient presents at today's visit and states that the Lidoderm patches did not help with his neck pain.  Patient states that a few months ago he felt a popping sensation behind his right ear and since that time he has had issues with pain in his upper neck and back.  Patient states that he also had onset of diarrhea but was given medication in the hospital which cleared this up patient states that he was also having nausea however he was given medicine at his last visit here which has caused the nausea to resolve.  Patient admits that he is concerned about the pain in his neck and upper back because his mother died about a year ago from a spinal infection that was related to her use of drugs.  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  Patient however is concerned that he may have contracted an infection in his spine as well.  Patient is aware that he has been diagnosed with hepatitis C and patient states that he has a uncle with the same condition who has developed liver cancer.      Patient reports that this past Tuesday, he had onset of sensation of difficulty breathing/taking in deep breaths.  Patient states that he also had discomfort in his anterior chest and he also felt as if he was wheezing.  Patient states that he was diagnosed with asthma as a child but thought that he had grown out of this.  Patient has had a mild cough but states that the cough is been nonproductive.  Patient also felt as if he lost his appetite for a few days last week but then by Friday he had regained his appetite and felt that he was eating more than usual.  Patient also has complaint of  noticing for several months that he cannot really taste things until immediately after he brushes his teeth but then within 10 minutes or so he is no longer able to taste anything again.  Patient does continue to smoke.       Patient reports history of allergies to sulfa and penicillin but he states that he was told that he was allergic to these medications in childhood therefore he does not know what side effects occur from the use of these medications.  Patient denies any past surgeries.  Patient is currently employed as a Administrator.  Patient states that he does sometimes have difficulty sleeping secondary to the pain in the back of his neck.  Patient states the pain is dull and aching and is about a 8 on a 0-to-10 scale.  Patient also has pain in the posterior neck/scalp area that is recurrent.  Patient has tried warm moist heat to these areas without any significant improvement. Past Medical History:  Diagnosis Date  . Asthma    Social History   Tobacco Use  . Smoking status: Current Every Day Smoker    Packs/day: 0.50    Types: Cigarettes  . Smokeless tobacco: Never Used  Substance Use Topics  . Alcohol use: No  . Drug use: Yes    Types: Marijuana  Allergies  Allergen Reactions  . Penicillins   . Sulfa Antibiotics Other (See Comments)    Unknown      Review of Systems  Constitutional: Positive for fatigue. Negative for chills, diaphoresis and fever.  HENT: Negative for dental problem, sinus pressure, sore throat and trouble swallowing.   Respiratory: Positive for cough, shortness of breath and wheezing.   Cardiovascular: Negative for chest pain, palpitations and leg swelling.  Gastrointestinal: Negative for abdominal pain, constipation, diarrhea and nausea.  Genitourinary: Negative for dysuria and frequency.  Musculoskeletal: Positive for arthralgias, back pain, myalgias, neck pain and neck stiffness. Negative for gait problem and joint swelling.  Neurological: Positive for  headaches. Negative for dizziness.       Objective:   Physical Exam BP 132/78   Pulse 92   Temp 97.8 F (36.6 C) (Oral)   Resp 18   Ht 5\' 10"  (1.778 m)   Wt 182 lb 6.4 oz (82.7 kg)   SpO2 98%   BMI 26.17 kg/m Vital signs and nurse's notes reviewed General-well-nourished, well-developed young adult male in no acute distress ENT-TMs dull, patient with moderate edema of the nasal turbinates with mild clear nasal discharge, patient with mild posterior pharynx erythema.  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 Neck-supple, patient does have some mild posterior cervical paraspinous spasm and tenderness of the right posterior occipital ridge.  Patient appears to have some mild cervicothoracic scoliosis in the upper back.  No carotid bruit, no cervical lymphadenopathy and no carotid bruit. Lungs- clear to auscultation bilaterally but patient with decreased air movement.  No accessory muscle use Cardiovascular-regular rate and rhythm Back-no CVA tenderness, patient with thoracolumbar bilateral paraspinous spasm Abdomen-soft, nontender Extremities-no edema Psychologic-normal mood and judgment        Assessment & Plan:  1. Neck pain Patient with complaint of recurrent neck pain since an episode in which he felt a popping sensation behind the right ear.  Patient does have some tenderness over the right posterior occipital ridge on examination.  And some mild cervical paraspinous spasm.  Patient is encouraged to use warm moist heat to the affected areas and patient may take nonsteroidal anti-inflammatories as needed.  Patient is also being sent in a prescription for a muscle relaxant to take at bedtime.  2. Muscle spasms of neck Patient is being sent in a prescription for prednisone taper to help with muscle spasm as well as with suspected asthma exacerbation/shortness of breath.  Patient also given prescription for Zanaflex 4 mg at bedtime to take as needed for  muscle spasm - predniSONE (DELTASONE) 20 MG tablet; 2 pills once daily for 2 days then 1 pill daily for 2 days then 1/2 pill daily for 4 days; take after eating  Dispense: 8 tablet; Refill: 0 - tiZANidine (ZANAFLEX) 4 MG capsule; Once daily at bedtime as needed for muscle spasm  Dispense: 30 capsule; Refill: 1  3. Shortness of breath Patient reports a childhood history of asthma and I suspect that this is the cause of his recent sensation of chest tightness and shortness of breath.  Patient does still have decreased air movement on today's exam.  Prescription is being provided for prednisone taper and Proventil inhaler as well as information regarding asthma as part of AVS.  Patient should call or return sooner if he has continued shortness of breath, increased shortness of breath or any concerns.  Smoking cessation was also encouraged - predniSONE (DELTASONE) 20 MG tablet; 2 pills once daily  for 2 days then 1 pill daily for 2 days then 1/2 pill daily for 4 days; take after eating  Dispense: 8 tablet; Refill: 0 - albuterol (PROVENTIL HFA;VENTOLIN HFA) 108 (90 Base) MCG/ACT inhaler; Inhale 2 puffs into the lungs every 6 (six) hours as needed for wheezing or shortness of breath. Any brand is okay  Dispense: 1 Inhaler; Refill: 2  4. Chronic bilateral thoracic back pain Patient appears to have some cervicothoracic scoliosis which may be playing a role in his chronic back pain.  5. Hepatitis C virus infection without hepatic coma, unspecified chronicity Discussed with patient that there are treatment options available for hepatitis C and patient is encouraged to apply for financial assistance so that arrangements can be made to have patient seen by a GI or liver specialist to see if he is a candidate for treatment.  Patient also needs to apply for financial assistance so that further evaluation and treatment can take place for his neck and upper back pain.  Patient wishes to defer x-rays at this time  secondary to cost.  *Patient was offered but declined influenza immunization at today's visit  An After Visit Summary was printed and given to the patient.  Return in about 6 weeks (around 03/18/2018) for f/u neck pain/SOB.

## 2018-02-04 NOTE — Progress Notes (Signed)
Flu no   Pain from neck through spine  Patient complained that last weeek he had an episode8 to where he felt he couldn't breath.   Appetite is normal, patient stated last week he didn't have one, taste buds gone, . Patient states he can only taste when he brushes his teeth

## 2018-03-18 ENCOUNTER — Ambulatory Visit: Payer: Self-pay | Admitting: Family Medicine

## 2018-04-10 ENCOUNTER — Other Ambulatory Visit: Payer: Self-pay

## 2018-04-10 ENCOUNTER — Ambulatory Visit: Payer: Self-pay | Attending: Family Medicine | Admitting: Physician Assistant

## 2018-04-10 VITALS — BP 135/90 | HR 82 | Temp 98.9°F | Resp 16 | Wt 193.0 lb

## 2018-04-10 DIAGNOSIS — R61 Generalized hyperhidrosis: Secondary | ICD-10-CM

## 2018-04-10 DIAGNOSIS — T162XXA Foreign body in left ear, initial encounter: Secondary | ICD-10-CM

## 2018-04-10 DIAGNOSIS — R042 Hemoptysis: Secondary | ICD-10-CM

## 2018-04-10 DIAGNOSIS — Z880 Allergy status to penicillin: Secondary | ICD-10-CM | POA: Insufficient documentation

## 2018-04-10 DIAGNOSIS — F17200 Nicotine dependence, unspecified, uncomplicated: Secondary | ICD-10-CM

## 2018-04-10 DIAGNOSIS — F17210 Nicotine dependence, cigarettes, uncomplicated: Secondary | ICD-10-CM

## 2018-04-10 DIAGNOSIS — Z79899 Other long term (current) drug therapy: Secondary | ICD-10-CM | POA: Insufficient documentation

## 2018-04-10 DIAGNOSIS — R0981 Nasal congestion: Secondary | ICD-10-CM

## 2018-04-10 DIAGNOSIS — J45909 Unspecified asthma, uncomplicated: Secondary | ICD-10-CM | POA: Insufficient documentation

## 2018-04-10 DIAGNOSIS — Z882 Allergy status to sulfonamides status: Secondary | ICD-10-CM | POA: Insufficient documentation

## 2018-04-10 MED ORDER — FLUTICASONE PROPIONATE 50 MCG/ACT NA SUSP: 2.0000 | Freq: Every day | NASAL | 6 refills | Status: DC

## 2018-04-10 NOTE — Progress Notes (Signed)
Patient ID: Kevin Reddendam Luu, male   DOB: [birth year], 27 y.o.   MRN: [id number]    Kevin Reddendam Blakeney, is a 27 y.o. male  YQM:[id number]  [id number]  DOB - [birth year]  Subjective:  Chief Complaint and HPI: Kevin Mack is a 27 y.o. male here today with multiple complaints.  He c/o coughing/spitting/vomiting up blood now for several weeks.  Occurs almost every morning.  +smoker since age 27 and smokes 1 to 1.5 packs cigs per day.  It is very unclear whether he is truly vomiting or coughing until the point he gags(difficult to obtain history from).  No melena/hematochezia.  Occasionally feels nauseated.  Also c/o chronic congestion and sinus HA that have been occurring more than 6 months.  No discolored mucus currently.  Denies drinking alcohol.     ROS:   Constitutional:  No f/c, No night sweats, No unexplained weight loss. EENT:  No vision changes, No blurry vision, No hearing changes. No mouth, throat, or ear problems.  Respiratory: +occasional cough, No SOB Cardiac: No CP, no palpitations GI:  No abd pain, + N/V/D. GU: No Urinary s/sx Musculoskeletal: No joint pain Neuro: sinus headache, no dizziness, no motor weakness.  Skin: No rash Endocrine:  No polydipsia. No polyuria.  Psych: Denies SI/HI  No problems updated.  ALLERGIES: Allergies  Allergen Reactions  . Penicillins   . Sulfa Antibiotics Other (See Comments)    Unknown    PAST MEDICAL HISTORY: Past Medical History:  Diagnosis Date  . Asthma     MEDICATIONS AT HOME: Prior to Admission medications   Medication Sig Start Date End Date Taking? Authorizing Provider  albuterol (PROVENTIL HFA;VENTOLIN HFA) 108 (90 Base) MCG/ACT inhaler Inhale 2 puffs into the lungs every 6 (six) hours as needed for wheezing or shortness of breath. Any brand is okay 02/04/18  Yes Fulp, Cammie, MD  ondansetron (ZOFRAN) 4 MG tablet Take 1 tablet (4 mg total) by mouth every 8 (eight) hours as needed for nausea or vomiting. 01/08/18  Yes Danelle EarthlyNoel,  Tiffany S, PA-C  tiZANidine (ZANAFLEX) 4 MG capsule Once daily at bedtime as needed for muscle spasm 02/04/18  Yes Fulp, Cammie, MD  fluticasone (FLONASE) 50 MCG/ACT nasal spray Place 2 sprays into both nostrils daily. 04/10/18   Anders SimmondsMcClung,  M, PA-C  lidocaine (LIDODERM) 5 % Place 1 patch onto the skin daily. Remove & Discard patch within 12 hours or as directed by MD Patient not taking: Reported on 04/10/2018 01/08/18   Vivianne MasterNoel, Tiffany S, PA-C  predniSONE (DELTASONE) 20 MG tablet 2 pills once daily for 2 days then 1 pill daily for 2 days then 1/2 pill daily for 4 days; take after eating Patient not taking: Reported on 04/10/2018 02/04/18   Fulp, Hewitt Shortsammie, MD     Objective:  EXAM:   Vitals:   04/10/18 1604  BP: 135/90  Pulse: 82  Resp: 16  Temp: 98.9 F (37.2 C)  TempSrc: Oral  SpO2: 98%  Weight: 193 lb (87.5 kg)    General appearance : A&OX3. NAD. Non-toxic-appearing HEENT: Atraumatic and Normocephalic.  PERRLA. EOM intact.  R TM bulging with fluid.  L TM only partially visible-there appears to be an insect that seems dead that is right up against the TM.   Mouth-MMM, post pharynx WNL w/o erythema, + PND.  Turbinates inflamed and appear chronically aggravated Neck: supple, no JVD. No cervical lymphadenopathy. No thyromegaly Chest/Lungs:  Breathing-non-labored, Good air entry bilaterally, breath sounds normal without rales, rhonchi, or wheezing  CVS: S1 S2  regular, no murmurs, gallops, rubs  Abdomen: Bowel sounds present, Non tender and not distended with no gaurding, rigidity or rebound. Extremities: Bilateral Lower Ext shows no edema, both legs are warm to touch with = pulse throughout Neurology:  CN II-XII grossly intact, Non focal.   Psych:  TP scattered. Poor historian. J/I fair. Normal speech. Appropriate eye contact and blunted affect.  Skin:  No Rash  Data Review No results found for: HGBA1C   Assessment & Plan   1. Hemoptysis vs hematemesis vs sinus related - DG  Chest 2 View; Future - H. pylori breath test - CBC with Differential/Platelet  2. Smoking cessation advised.  Contemplative stage of change - DG Chest 2 View; Future  3. Night sweats - DG Chest 2 View; Future - TSH - CBC with Differential/Platelet  4. Chronic nasal congestion Likely due to smoking.   Stop smoking - fluticasone (FLONASE) 50 MCG/ACT nasal spray; Place 2 sprays into both nostrils daily.  Dispense: 16 g; Refill: 6  5. FB ear, left, initial encounter There appears to be a dead insect in his ear  - Ambulatory referral to ENT     Patient have been counseled extensively about nutrition and exercise  Return in about 1 month (around 05/11/2018) for Dr Jillyn HiddenFulp for f/up.  The patient was given clear instructions to go to ER or return to medical center if symptoms don't improve, worsen or new problems develop. The patient verbalized understanding. The patient was told to call to get lab results if they haven't heard anything in the next week.     Georgian CoAngela , PA-C [facility] [place], [place] [phone]   04/10/2018, 4:45 PM

## 2018-04-10 NOTE — Progress Notes (Signed)
hematemesis x 2 weeks  Headache, head feels full, and fatiguel everyday

## 2018-04-10 NOTE — Patient Instructions (Signed)
Steps to Quit Smoking    Smoking tobacco can be bad for your health. It can also affect almost every organ in your body. Smoking puts you and people around you at risk for many serious long-lasting (chronic) diseases. Quitting smoking is hard, but it is one of the best things that you can do for your health. It is never too late to quit.  What are the benefits of quitting smoking?  When you quit smoking, you lower your risk for getting serious diseases and conditions. They can include:  · Lung cancer or lung disease.  · Heart disease.  · Stroke.  · Heart attack.  · Not being able to have children (infertility).  · Weak bones (osteoporosis) and broken bones (fractures).  If you have coughing, wheezing, and shortness of breath, those symptoms may get better when you quit. You may also get sick less often. If you are pregnant, quitting smoking can help to lower your chances of having a baby of low birth weight.  What can I do to help me quit smoking?  Talk with your doctor about what can help you quit smoking. Some things you can do (strategies) include:  · Quitting smoking totally, instead of slowly cutting back how much you smoke over a period of time.  · Going to in-person counseling. You are more likely to quit if you go to many counseling sessions.  · Using resources and support systems, such as:  ? Online chats with a counselor.  ? Phone quitlines.  ? Printed self-help materials.  ? Support groups or group counseling.  ? Text messaging programs.  ? Mobile phone apps or applications.  · Taking medicines. Some of these medicines may have nicotine in them. If you are pregnant or breastfeeding, do not take any medicines to quit smoking unless your doctor says it is okay. Talk with your doctor about counseling or other things that can help you.  Talk with your doctor about using more than one strategy at the same time, such as taking medicines while you are also going to in-person counseling. This can help make  quitting easier.  What things can I do to make it easier to quit?  Quitting smoking might feel very hard at first, but there is a lot that you can do to make it easier. Take these steps:  · Talk to your family and friends. Ask them to support and encourage you.  · Call phone quitlines, reach out to support groups, or work with a counselor.  · Ask people who smoke to not smoke around you.  · Avoid places that make you want (trigger) to smoke, such as:  ? Bars.  ? Parties.  ? Smoke-break areas at work.  · Spend time with people who do not smoke.  · Lower the stress in your life. Stress can make you want to smoke. Try these things to help your stress:  ? Getting regular exercise.  ? Deep-breathing exercises.  ? Yoga.  ? Meditating.  ? Doing a body scan. To do this, close your eyes, focus on one area of your body at a time from head to toe, and notice which parts of your body are tense. Try to relax the muscles in those areas.  · Download or buy apps on your mobile phone or tablet that can help you stick to your quit plan. There are many free apps, such as QuitGuide from the CDC (Centers for Disease Control and Prevention). You can find more   support from smokefree.gov and other websites.  This information is not intended to replace advice given to you by your health care provider. Make sure you discuss any questions you have with your health care provider.  Document Released: 01/27/2009 Document Revised: 11/29/2015 Document Reviewed: 08/17/2014  Elsevier Interactive Patient Education © 2019 [employer] Inc[employer]

## 2018-04-11 ENCOUNTER — Ambulatory Visit (HOSPITAL_COMMUNITY)
Admission: RE | Admit: 2018-04-11 | Discharge: 2018-04-11 | Disposition: A | Discharge status: Home / Self Care | Payer: Self-pay | Source: Ambulatory Visit | Attending: Physician Assistant | Admitting: Physician Assistant

## 2018-04-11 DIAGNOSIS — R61 Generalized hyperhidrosis: Secondary | ICD-10-CM | POA: Insufficient documentation

## 2018-04-11 DIAGNOSIS — F17200 Nicotine dependence, unspecified, uncomplicated: Secondary | ICD-10-CM | POA: Insufficient documentation

## 2018-04-11 DIAGNOSIS — R042 Hemoptysis: Secondary | ICD-10-CM | POA: Insufficient documentation

## 2018-04-11 LAB — CBC WITH DIFFERENTIAL/PLATELET
BASOS: 1 %
Basophils Absolute: 0.1 10*3/uL (ref 0.0–0.2)
EOS (ABSOLUTE): 0.3 10*3/uL (ref 0.0–0.4)
Eos: 3 %
Hematocrit: 47.3 % (ref 37.5–51.0)
Hemoglobin: 16.1 g/dL (ref 13.0–17.7)
IMMATURE GRANS (ABS): 0 10*3/uL (ref 0.0–0.1)
Immature Granulocytes: 0 %
LYMPHS ABS: 3.4 10*3/uL — AB (ref 0.7–3.1)
Lymphs: 32 %
MCH: 29.3 pg (ref 26.6–33.0)
MCHC: 34 g/dL (ref 31.5–35.7)
MCV: 86 fL (ref 79–97)
MONOS ABS: 0.8 10*3/uL (ref 0.1–0.9)
Monocytes: 7 %
NEUTROS ABS: 6.1 10*3/uL (ref 1.4–7.0)
Neutrophils: 57 %
PLATELETS: 276 10*3/uL (ref 150–450)
RBC: 5.5 x10E6/uL (ref 4.14–5.80)
RDW: 12.7 % (ref 12.3–15.4)
WBC: 10.6 10*3/uL (ref 3.4–10.8)

## 2018-04-11 LAB — H. PYLORI BREATH TEST: H pylori Breath Test: NEGATIVE

## 2018-04-11 LAB — TSH: TSH: 2.08 u[IU]/mL (ref 0.450–4.500)

## 2018-04-11 MED FILL — FLUTICASONE PROP 50 MCG SPR: 50 | 30 days supply | Qty: 16 | Fill #0

## 2018-04-15 ENCOUNTER — Telehealth: Payer: Self-pay | Admitting: Family Medicine

## 2018-04-15 NOTE — Telephone Encounter (Signed)
Patient called to get an update on their x-ray results. Please follow up.

## 2018-04-15 NOTE — Telephone Encounter (Signed)
Patient verified DOB Patient is aware of h pylori being negative and chest xray being negative. TSH/CBC were also normal. No further questions.

## 2018-04-22 ENCOUNTER — Telehealth (INDEPENDENT_AMBULATORY_CARE_PROVIDER_SITE_OTHER): Payer: Self-pay

## 2018-04-22 NOTE — Telephone Encounter (Signed)
Patient verified DOB. He is aware that stomach ulcer test and chest x-ray were negative. Follow up as planned. Maryjean Morn, CMA

## 2018-04-22 NOTE — Telephone Encounter (Signed)
-----   Message from Anders Simmonds, [place] sent at 04/13/2018  2:39 PM EST ----- Please call patient.  Stomach ulcer test and chest X-ray are negative.  Follow-up as planned.  Thanks, Georgian Co, PA-C

## 2018-04-23 ENCOUNTER — Ambulatory Visit: Payer: Self-pay | Attending: Family Medicine

## 2018-05-12 ENCOUNTER — Encounter: Payer: Self-pay | Admitting: Family Medicine

## 2018-05-12 ENCOUNTER — Ambulatory Visit: Payer: Self-pay | Attending: Family Medicine | Admitting: Family Medicine

## 2018-05-12 VITALS — BP 119/82 | HR 94 | Temp 98.5°F | Resp 18 | Ht 71.0 in | Wt 187.0 lb

## 2018-05-12 DIAGNOSIS — F17210 Nicotine dependence, cigarettes, uncomplicated: Secondary | ICD-10-CM | POA: Insufficient documentation

## 2018-05-12 DIAGNOSIS — M546 Pain in thoracic spine: Secondary | ICD-10-CM

## 2018-05-12 DIAGNOSIS — T162XXD Foreign body in left ear, subsequent encounter: Secondary | ICD-10-CM

## 2018-05-12 DIAGNOSIS — M542 Cervicalgia: Secondary | ICD-10-CM

## 2018-05-12 DIAGNOSIS — G8929 Other chronic pain: Secondary | ICD-10-CM

## 2018-05-12 DIAGNOSIS — J45909 Unspecified asthma, uncomplicated: Secondary | ICD-10-CM | POA: Insufficient documentation

## 2018-05-12 DIAGNOSIS — M62838 Other muscle spasm: Secondary | ICD-10-CM

## 2018-05-12 NOTE — Progress Notes (Signed)
Subjective:    Patient ID: Kevin Mack, male    DOB: 04-04-1991, 28 y.o.   MRN: [id number]  HPI       28 yo male with complaint of chronic neck pain that is currently mild and patient also has some recurrent issues with upper back pain as well.  Patient states that the pain in his neck and upper back tend to be worse in the summer.  Patient works in grounds maintenance.  Patient reports that currently he is not having daily neck pain.  Patient still has Zanaflex to take as needed for muscle spasm and takes over-the-counter pain medication as needed but states he is trying to avoid the use of pain medication.  Current pain when it occurs is a 3-4 at its worst and is a tightness/spasm sensation in his neck and upper back.  Patient would like to have x-rays done of his neck and upper back due to the recurrent nature of his pain as this has been occurring for many years.      Patient reports that at his last visit here, he was told that he appeared to have a bug in his left ear.  The person seen the patient told him that they did not have the right equipment to remove the bug from his ear at that visit.  Patient states that he can sometimes feel a spongy sensation in his left ear and sometimes feels that he does not hear as well.  Patient states that he is not having any real ear pain but would like to have the bug removed.  Patient believes that he recalls 1 night a few months ago when he awakened with a sensation of something in his ear.  Patient denies any current headache or dizziness.  No shortness of breath or cough.  Patient has some occasional nasal congestion but not currently.  Past Medical History:  Diagnosis Date  . Asthma    Social History   Tobacco Use  . Smoking status: Current Every Day Smoker    Packs/day: 0.10    Types: Cigarettes  . Smokeless tobacco: Never Used  Substance Use Topics  . Alcohol use: No  . Drug use: Yes    Types: Marijuana   Allergies  Allergen Reactions    . Penicillins   . Sulfa Antibiotics Other (See Comments)    Unknown            Review of Systems  Constitutional: Negative for chills, fatigue and fever.  HENT: Positive for hearing loss (occasional sensation of decreased hearing in the left ear). Negative for ear discharge and ear pain.   Respiratory: Negative for cough and shortness of breath.   Cardiovascular: Negative for chest pain, palpitations and leg swelling.  Gastrointestinal: Negative for abdominal pain, nausea and vomiting.  Endocrine: Negative for polydipsia, polyphagia and polyuria.  Genitourinary: Negative for dysuria, flank pain and frequency.  Musculoskeletal: Positive for back pain, neck pain and neck stiffness. Negative for gait problem.  Neurological: Negative for dizziness and headaches.  Hematological: Negative for adenopathy. Does not bruise/bleed easily.       Objective:   Physical Exam BP 119/82 (BP Location: Left Arm, Patient Position: Sitting, Cuff Size: Normal)   Pulse 94   Temp 98.5 F (36.9 C) (Oral)   Resp 18   Ht 5\' 11"  (1.803 m)   Wt 187 lb (84.8 kg)   SpO2 99%   BMI 26.08 kg/m Nurse's notes and vital signs reviewed General-well-nourished, well-developed young  adult male in no acute distress ENT-right TM within normal, left TM partially obscured by a brownish mass that could represent wax or foreign body/bug that is very close to the TM.  Ear lavage was attempted which did move the foreign body/bug minimally away from the eardrum however foreign body could not be washed out and ear curette could not reach the area without fear of touching the TM as well.  Nares with mild edema/erythema the nasal turbinates, normal oropharynx Neck-supple, no lymphadenopathy, patient has some mild bilateral cervical paraspinous spasm but is nontender to palpation over the cervical musculature or over the cervical spine Lungs-clear to auscultation bilaterally Cardiovascular-regular rate and rhythm Abdomen-soft,  nontender Back-no CVA tenderness; patient with some cervicothoracic paraspinous spasm but no reproducible tenderness over the cervical, thoracic or lumbosacral spine.  Mild spasm of the trapezius area bilaterally      Assessment & Plan:  1. Foreign body of left ear, subsequent encounter Ear lavage and use of an ear curette were done at today's visit without successful removal of the foreign body.  Patient is encouraged to try over-the-counter Debrox twice per week for the next few weeks to see if perhaps this can dissolve the debris/foreign body and patient can return for nurse visit for ear lavage in the next few weeks if he would like to do so. - Ear Lavage  2. Muscle spasms of neck Patient with muscle spasms of the neck and neck pain.  Patient still has Zanaflex to use as needed.  Order placed for cervical spine x-ray and thoracic spine x-ray - DG Cervical Spine Complete; Future  3. Chronic bilateral thoracic back pain Patient will have x-rays of the cervical and thoracic spine and patient is encouraged to continue the use of over-the-counter pain medication, warm moist heat and Zanaflex as needed for muscle spasm - DG Thoracic Spine W/Swimmers; Future  4. Neck pain Patient with complaint of chronic recurrent neck pain.  Order placed for cervical spine film.  Patient reports that his current pain is minimal.  Patient may continue the use of over-the-counter pain medications, Zanaflex as needed for muscle spasm and the use of warm moist heat. - DG Cervical Spine Complete; Future  An After Visit Summary was printed and given to the patient.  Return in about 4 months (around 09/10/2018), or if symptoms worsen or fail to improve, for neck pain f/u if needed in 3-4 months.

## 2018-06-10 ENCOUNTER — Ambulatory Visit (HOSPITAL_COMMUNITY)
Admission: RE | Admit: 2018-06-10 | Discharge: 2018-06-10 | Disposition: A | Discharge status: Home / Self Care | Payer: Self-pay | Source: Ambulatory Visit | Attending: Family Medicine | Admitting: Family Medicine

## 2018-06-10 DIAGNOSIS — G8929 Other chronic pain: Secondary | ICD-10-CM | POA: Insufficient documentation

## 2018-06-10 DIAGNOSIS — M542 Cervicalgia: Secondary | ICD-10-CM | POA: Insufficient documentation

## 2018-06-10 DIAGNOSIS — M62838 Other muscle spasm: Secondary | ICD-10-CM | POA: Insufficient documentation

## 2018-06-10 DIAGNOSIS — M546 Pain in thoracic spine: Secondary | ICD-10-CM | POA: Insufficient documentation

## 2018-06-12 ENCOUNTER — Telehealth: Payer: Self-pay | Admitting: Family Medicine

## 2018-06-12 NOTE — Telephone Encounter (Signed)
Patient verified DOB Patient is aware of both xrays being normal and to follow up as planned.

## 2018-06-12 NOTE — Telephone Encounter (Signed)
Patient called to get an update on their x-rays was advised 3-5 days processing.

## 2018-06-17 ENCOUNTER — Emergency Department (HOSPITAL_COMMUNITY)
Admission: EM | Admit: 2018-06-17 | Discharge: 2018-06-17 | Disposition: A | Discharge status: Home / Self Care | Payer: Self-pay | Attending: Emergency Medicine | Admitting: Emergency Medicine

## 2018-06-17 ENCOUNTER — Encounter (HOSPITAL_COMMUNITY): Payer: Self-pay

## 2018-06-17 ENCOUNTER — Other Ambulatory Visit: Payer: Self-pay

## 2018-06-17 DIAGNOSIS — X58XXXA Exposure to other specified factors, initial encounter: Secondary | ICD-10-CM | POA: Insufficient documentation

## 2018-06-17 DIAGNOSIS — J45909 Unspecified asthma, uncomplicated: Secondary | ICD-10-CM | POA: Insufficient documentation

## 2018-06-17 DIAGNOSIS — T162XXA Foreign body in left ear, initial encounter: Secondary | ICD-10-CM

## 2018-06-17 DIAGNOSIS — Y929 Unspecified place or not applicable: Secondary | ICD-10-CM | POA: Insufficient documentation

## 2018-06-17 DIAGNOSIS — H9192 Unspecified hearing loss, left ear: Secondary | ICD-10-CM | POA: Insufficient documentation

## 2018-06-17 DIAGNOSIS — Y939 Activity, unspecified: Secondary | ICD-10-CM | POA: Insufficient documentation

## 2018-06-17 DIAGNOSIS — Y999 Unspecified external cause status: Secondary | ICD-10-CM | POA: Insufficient documentation

## 2018-06-17 DIAGNOSIS — F17210 Nicotine dependence, cigarettes, uncomplicated: Secondary | ICD-10-CM | POA: Insufficient documentation

## 2018-06-17 DIAGNOSIS — S161XXA Strain of muscle, fascia and tendon at neck level, initial encounter: Secondary | ICD-10-CM | POA: Insufficient documentation

## 2018-06-17 NOTE — ED Triage Notes (Signed)
Pt reports he has been having pressure in the back of his head for a while. He states this morning he woke up with stiffness of the neck. Painful to move or cough. Afebrile.

## 2018-06-17 NOTE — Discharge Instructions (Signed)

## 2018-06-17 NOTE — ED Notes (Signed)
Pt aLSO HAS BUG IN HIS EAR THAT HE NEEDS OUT, DR LONG GIVEN ALLAGATOR FIORCEPS

## 2018-06-17 NOTE — ED Notes (Addendum)
States  Had xrays last week for same pain in his neck ( x several months)  [facility] and was told it was nothing, last night pain got worse, feels a little nauseated at times

## 2018-06-17 NOTE — ED Provider Notes (Signed)
Emergency Department Provider Note   I have reviewed the triage vital signs and the nursing notes.   HISTORY  Chief Complaint Headache   HPI Kevin Mack is a 28 y.o. male with PMH of asthma presents to the emergency department for evaluation of neck pain.  Pain has been chronic over the past several months.  Patient states that 1 day he had a cough and felt a pop with some pressure which is been present since that time.  This morning, he woke up with increased stiffness in the neck and difficulty turning his head.  He describes a heavy feeling in his head but denies headache.  No sudden onset, severe headache symptoms.  No vision changes.  Patient saw his PCP recently who performed plain films of the thoracic and cervical spine.  Patient took a Zanaflex this morning which improved his stiffness symptoms.  No fevers.   Patient also describes a muffled sound in his left ear and reports that there is a bug stuck in the left ear.  He states he told his PCP about this and, according to him, they have ordered a tool to remove the bug.   Past Medical History:  Diagnosis Date  . Asthma     There are no active problems to display for this patient.   History reviewed. No pertinent surgical history.  Allergies Penicillins and Sulfa antibiotics  History reviewed. No pertinent family history.  Social History Social History   Tobacco Use  . Smoking status: Current Every Day Smoker    Packs/day: 0.10    Types: Cigarettes  . Smokeless tobacco: Never Used  Substance Use Topics  . Alcohol use: No  . Drug use: Yes    Types: Marijuana    Review of Systems  Constitutional: No fever/chills Eyes: No visual changes. ENT: No sore throat. Positive bug in the left ear.  Cardiovascular: Denies chest pain. Respiratory: Denies shortness of breath. Gastrointestinal: No abdominal pain.  No nausea, no vomiting.  No diarrhea.  No constipation. Genitourinary: Negative for  dysuria. Musculoskeletal: Negative for back pain. Positive neck pain.  Skin: Negative for rash. Neurological: Negative for headaches, focal weakness or numbness.  10-point ROS otherwise negative.  ____________________________________________   PHYSICAL EXAM:  VITAL SIGNS: ED Triage Vitals  Enc Vitals Group     BP 06/17/18 1354 (!) 151/96     Pulse Rate 06/17/18 1354 81     Resp 06/17/18 1354 18     Temp 06/17/18 1354 97.6 F (36.4 C)     Temp Source 06/17/18 1354 Oral     SpO2 06/17/18 1354 98 %   Constitutional: Alert and oriented. Well appearing and in no acute distress. Eyes: Conjunctivae are normal. PERRL. EOMI. Head: Atraumatic. Ears:  Healthy appearing ear canals. Insect appears in left ear. No surrounding discharge. TM normal after removal.  Nose: No congestion/rhinnorhea. Mouth/Throat: Mucous membranes are moist.  Neck: No stridor. No meningeal signs.  Cardiovascular: Normal rate, regular rhythm. Good peripheral circulation. Grossly normal heart sounds.   Respiratory: Normal respiratory effort.  No retractions. Lungs CTAB. Gastrointestinal: No distention.  Musculoskeletal: No lower extremity tenderness nor edema. No gross deformities of extremities. Neurologic:  Normal speech and language. No gross focal neurologic deficits are appreciated.  Skin:  Skin is warm, dry and intact. No rash noted.   ____________________________________________  RADIOLOGY  None ____________________________________________   PROCEDURES  Procedure(s) performed:   .Foreign Body Removal Date/Time: 06/17/2018 3:02 PM Performed by: Maia Plan, MD Authorized by:  Long, Arlyss Repress, MD  Consent: Verbal consent obtained. Risks and benefits: risks, benefits and alternatives were discussed Patient identity confirmed: verbally with patient Body area: ear Location details: left ear  Sedation: Patient sedated: no  Patient restrained: no Patient cooperative: yes Localization method:  visualized Removal mechanism: alligator forceps Complexity: simple 1 objects recovered. Objects recovered: roach Post-procedure assessment: foreign body removed Patient tolerance: Patient tolerated the procedure well with no immediate complications     ____________________________________________   INITIAL IMPRESSION / ASSESSMENT AND PLAN / ED COURSE  Pertinent labs & imaging results that were available during my care of the patient were reviewed by me and considered in my medical decision making (see chart for details).  She presents to the emergency department for evaluation of of neck pain.  His neck pain is chronic.  He has full range of motion of his neck with no concern for infectious etiology.  No weakness or numbness.  Do not feel the patient would benefit from additional blood work, imaging at this time.  I do visualize an insect in the left ear and will attempt removal with alligator forceps.  Insect removed as above. No evidence to suspect meningitis. No additional neck imaging at this time. Patient has MSK relaxer at home. Provided spine provider follow up information.  ____________________________________________  FINAL CLINICAL IMPRESSION(S) / ED DIAGNOSES  Final diagnoses:  Strain of neck muscle, initial encounter  Foreign body of left ear, initial encounter    Note:  This document was prepared using Dragon voice recognition software and may include unintentional dictation errors.  Alona Bene, MD Emergency Medicine    Long, Arlyss Repress, MD 06/18/18 1002

## 2018-06-19 ENCOUNTER — Ambulatory Visit: Payer: Self-pay | Attending: Family Medicine | Admitting: Family Medicine

## 2018-06-19 ENCOUNTER — Encounter: Payer: Self-pay | Admitting: Family Medicine

## 2018-06-19 VITALS — BP 128/80 | HR 79 | Temp 98.1°F | Ht 71.0 in | Wt 190.0 lb

## 2018-06-19 DIAGNOSIS — M62838 Other muscle spasm: Secondary | ICD-10-CM

## 2018-06-19 DIAGNOSIS — R350 Frequency of micturition: Secondary | ICD-10-CM

## 2018-06-19 LAB — POCT URINALYSIS DIP (CLINITEK)
Bilirubin, UA: NEGATIVE
Blood, UA: NEGATIVE
Glucose, UA: NEGATIVE mg/dL
Ketones, POC UA: NEGATIVE mg/dL
Leukocytes, UA: NEGATIVE
Nitrite, UA: NEGATIVE
POC PROTEIN,UA: NEGATIVE
Spec Grav, UA: 1.02
Urobilinogen, UA: 0.2 U/dL
pH, UA: 6

## 2018-06-19 LAB — GLUCOSE, POCT (MANUAL RESULT ENTRY): POC Glucose: 109 mg/dL — AB (ref 70–99)

## 2018-06-19 MED ORDER — PREDNISONE 20 MG PO TABS: ORAL_TABLET | ORAL | 0 refills | Status: DC

## 2018-06-19 MED ORDER — TIZANIDINE HCL 4 MG PO CAPS: ORAL_CAPSULE | ORAL | 1 refills | Status: DC

## 2018-06-19 NOTE — Progress Notes (Signed)
Established Patient Office Visit  Subjective:  Patient ID: Kevin Mack, male    DOB: 1990-05-16  Age: 28 y.o. MRN: [id number]  CC:  Chief Complaint  Patient presents with  . Hospitalization Follow-up    pressure in back of head, neck stiffness    HPI Kevin Mack presents for follow-up after recent emergency department visit on 06/17/2018.  Per emergency department notes, patient was seen secondary to complaint of acute on chronic neck pain.  Patient reported that he had awakened with increased neck stiffness and difficulty turning his head as well as a heavy sensation in his head but no actual headache.  Patient did feel better after taking a Zanaflex prior to presenting to the emergency department.  Patient also requested removal of a foreign body in his left ear which was not successfully removed at a recent office visit due to lack of appropriate equipment.Patient did have removal of foreign body from the left ear with the use of alligator forceps which per ED doctor appeared to be a roach.  Patient was also diagnosed with neck strain.  Patient was not prescribed any additional medication and since patient had had recent x-rays of the cervical and thoracic spine, no imaging was done during his emergency department visit.      At today's visit, patient with continued complaint of sensation of neck stiffness as well as discomfort at the back of the scalp which he describes as a tightness and heaviness of his head.  Patient states that with the use of Zanaflex, his girlfriend has told him that he seems to sleep better and patient reports that he has had a decrease in sensation of neck stiffness.  Patient denies any numbness or tingling in his shoulders, arms or fingertips.  Patient also denies any dizziness or loss of balance.  Patient states that his hearing has improved status post removal of the insect from his left ear canal.  Patient denies any current ear pain.  Patient denies any fever or  chills.  Patient denies any visual disturbance related to his neck pain.  He reports that his symptoms started a few months ago when he felt an acute pop in the right to mid back of his neck and since that time he is continued to have recurrent pain and stiffness in his neck.  Patient works in Aeronautical engineer and reports increased neck/upper back spasm and discomfort by the end of the workday.      He reports that he would like to be checked for diabetes as he has had some recent increase in urinary frequency but patient states that he also tends to drink a lot of fluids/water as he works outdoors.  No dysuria.  He denies any blurred vision.  He is not sure if he has increased thirst or if he is drinking more water due to his outdoor job with possible dehydration.  Past Medical History:  Diagnosis Date  . Asthma     Social History   Socioeconomic History  . Marital status: Single    Spouse name: Not on file  . Number of children: Not on file  . Years of education: Not on file  . Highest education level: Not on file  Occupational History  . Not on file  Social Needs  . Financial resource strain: Not on file  . Food insecurity:    Worry: Not on file    Inability: Not on file  . Transportation needs:    Medical: Not on file  Non-medical: Not on file  Tobacco Use  . Smoking status: Current Every Day Smoker    Packs/day: 0.10    Types: Cigarettes  . Smokeless tobacco: Never Used  Substance and Sexual Activity  . Alcohol use: No  . Drug use: Yes    Types: Marijuana  . Sexual activity: Yes  Lifestyle  . Physical activity:    Days per week: Not on file    Minutes per session: Not on file  . Stress: Not on file  Relationships  . Social connections:    Talks on phone: Not on file    Gets together: Not on file    Attends religious service: Not on file    Active member of club or organization: Not on file    Attends meetings of clubs or organizations: Not on file    Relationship  status: Not on file  . Intimate partner violence:    Fear of current or ex partner: Not on file    Emotionally abused: Not on file    Physically abused: Not on file    Forced sexual activity: Not on file  Other Topics Concern  . Not on file  Social History Narrative  . Not on file    Outpatient Medications Prior to Visit  Medication Sig Dispense Refill  . albuterol (PROVENTIL HFA;VENTOLIN HFA) 108 (90 Base) MCG/ACT inhaler Inhale 2 puffs into the lungs every 6 (six) hours as needed for wheezing or shortness of breath. Any brand is okay 1 Inhaler 2  . tiZANidine (ZANAFLEX) 4 MG capsule Once daily at bedtime as needed for muscle spasm 30 capsule 1   No facility-administered medications prior to visit.     Allergies  Allergen Reactions  . Penicillins   . Sulfa Antibiotics Other (See Comments)    Unknown    ROS Review of Systems  Constitutional: Positive for fatigue. Negative for chills and fever.  HENT: Negative for ear discharge, ear pain and hearing loss.   Eyes: Negative for photophobia and visual disturbance.  Respiratory: Negative for cough, shortness of breath and wheezing.   Gastrointestinal: Negative for abdominal pain, constipation, diarrhea and nausea.  Endocrine: Negative for polydipsia, polyphagia and polyuria.  Genitourinary: Negative for dysuria and frequency.  Musculoskeletal: Positive for neck pain and neck stiffness.  Neurological: Positive for headaches. Negative for dizziness, light-headedness and numbness.  Hematological: Negative for adenopathy. Does not bruise/bleed easily.  Psychiatric/Behavioral: Positive for sleep disturbance. Negative for self-injury and suicidal ideas.      Objective:    Physical Exam  Constitutional: He is oriented to person, place, and time. He appears well-developed and well-nourished. No distress.  HENT:  Head: Normocephalic and atraumatic.  Right Ear: Hearing, tympanic membrane, external ear and ear canal normal.  Left  Ear: Hearing, tympanic membrane, external ear and ear canal normal.  Nose: Mucosal edema present. No rhinorrhea.  Mouth/Throat: Oropharynx is clear and moist and mucous membranes are normal.  Cardiovascular: Normal rate and regular rhythm.  Pulmonary/Chest: Effort normal.  Abdominal: Soft. Bowel sounds are normal. There is no abdominal tenderness.  Musculoskeletal: Normal range of motion.        General: Tenderness present. No edema.       Arms:  Neurological: He is alert and oriented to person, place, and time. No cranial nerve deficit.  Skin: Skin is warm and dry.  Psychiatric: He has a normal mood and affect. His behavior is normal. Judgment and thought content normal.  Nursing note and vitals reviewed.  BP 128/80 (BP Location: Right Arm, Patient Position: Sitting, Cuff Size: Normal)   Pulse 79   Temp 98.1 F (36.7 C) (Oral)   Ht  (1.803 m)   Wt 190 lb (86.2 kg)   SpO2 97%   BMI 26.50 kg/m  Wt Readings from Last 3 Encounters:  06/19/18 190 lb (86.2 kg)  05/12/18 187 lb (84.8 kg)  04/10/18 193 lb (87.5 kg)     Health Maintenance Due  Topic Date Due  . INFLUENZA VACCINE  11/14/2017    There are no preventive care reminders to display for this patient.  Lab Results  Component Value Date   TSH 2.080 04/10/2018   Lab Results  Component Value Date   WBC 10.6 04/10/2018   HGB 16.1 04/10/2018   HCT 47.3 04/10/2018   MCV 86 04/10/2018   PLT 276 04/10/2018   Lab Results  Component Value Date   NA 140 12/24/2017   K 3.8 12/24/2017   CO2 23 12/24/2017   GLUCOSE 93 12/24/2017   BUN 16 12/24/2017   CREATININE 0.93 12/24/2017   BILITOT 1.6 (H) 12/24/2017   ALKPHOS 79 12/24/2017   AST 20 12/24/2017   ALT 21 12/24/2017   PROT 7.3 12/24/2017   ALBUMIN 4.5 12/24/2017   CALCIUM 9.2 12/24/2017   ANIONGAP 10 12/24/2017   No results found for: CHOL No results found for: HDL No results found for: LDLCALC No results found for: TRIG No results found for:  CHOLHDL No results found for: ZOXW9U    Assessment & Plan:  1. Urinary frequency Patient with complaint of urinary frequency and patient with random blood sugar of 109 at today's visit.  Patient did eat within 2 hours of today's visit therefore patient's glucose is normal.  Patient also had urinalysis in follow-up of his complaint of urinary frequency and urinalysis was normal.  No evidence of glucose in the urine, no leukocytes, nitrites or blood which will require evaluation for infection.  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 - POCT URINALYSIS DIP (CLINITEK) - POCT glucose (manual entry)  2. Muscle spasms of neck Patient with complaint of continued muscle spasms in his posterior neck and at the posterior occipital ridges of the scalp.  Patient had recent x-rays which showed no abnormalities of the cervical or thoracic spine on plain film x-rays.  Discussed with the patient that he should complete application and make an appointment with the financial counselors to see if he qualifies for financial assistance as patient may need MRI if he has continued neck pain.  Also discussed with the patient that some of his symptoms do seem to be related to muscle spasm and patient will be placed on prednisone taper as well as refill of Zanaflex to take at bedtime as needed for muscle spasm.  Patient should call or return if he does not have any improvement in his symptoms.  Patient may also take low-dose nonsteroidal anti-inflammatories or Tylenol to help with neck pain/tension type headaches at the posterior occipital ridges. - predniSONE (DELTASONE) 20 MG tablet; Take 3 pills today then 2 pills daily x 2 days, 1 pill daily x 2 days then 1/2 pill daily x 4 days; take after eating  Dispense: 11 tablet; Refill:  0 - tiZANidine (ZANAFLEX) 4 MG capsule; Once daily at bedtime as needed for  muscle spasm  Dispense: 30 capsule; Refill: 1  An After Visit Summary was printed and given to the patient.  Follow-up: Return in about 3 weeks (around 07/10/2018) for posterior headache/muscle spasm.   Cain Saupe, MD

## 2018-06-19 NOTE — Progress Notes (Signed)
Pt here for hospital follow up, complaining of pressure in the back of the head and neck stiffness. Also needs refill for zanaflex and albuterol inhaler.

## 2018-06-26 ENCOUNTER — Encounter: Payer: Self-pay | Admitting: Family Medicine

## 2018-07-10 ENCOUNTER — Other Ambulatory Visit: Payer: Self-pay

## 2018-07-10 ENCOUNTER — Ambulatory Visit: Payer: Self-pay | Attending: Family Medicine | Admitting: Family Medicine

## 2018-07-10 ENCOUNTER — Encounter: Payer: Self-pay | Admitting: Family Medicine

## 2018-07-10 DIAGNOSIS — M542 Cervicalgia: Secondary | ICD-10-CM

## 2018-07-10 DIAGNOSIS — B1920 Unspecified viral hepatitis C without hepatic coma: Secondary | ICD-10-CM | POA: Insufficient documentation

## 2018-07-10 DIAGNOSIS — M62838 Other muscle spasm: Secondary | ICD-10-CM

## 2018-07-10 DIAGNOSIS — J4520 Mild intermittent asthma, uncomplicated: Secondary | ICD-10-CM | POA: Insufficient documentation

## 2018-07-10 DIAGNOSIS — M545 Low back pain, unspecified: Secondary | ICD-10-CM

## 2018-07-10 NOTE — Progress Notes (Signed)
Virtual Visit via Telephone Note Due to current restrictions/limitations of in-office visits due to the COVID-19 pandemic, this scheduled clinical appointment was converted to a telehealth/virtual clinical consultation/visit  I connected with Kevin Mack on 07/10/18 at  2:10 PM EDT by telephone and verified that I am speaking with the correct person using two identifiers.   I discussed the limitations, risks, security and privacy concerns of performing an evaluation and management service by telephone and the availability of in person appointments. I also discussed with the patient that there may be a patient responsible charge related to this service. The patient expressed understanding and agreed to proceed.  Patient location: Home Physician location: Office  Names of all parties present: Kevin Mack-patient, Alyissa Whidbee MD-physician   History of Present Illness: 28 year old male who is being evaluated in follow-up of several months of recurrent neck pain/stiffness and patient with new complaint of acute onset of mid low back pain without radiation.  Patient reports improvement in his neck pain.  Patient states that he would no longer consider his symptoms as pain but rather a pressure sensation in his upper neck/posterior scalp area.  Patient states that the discomfort is about a 3-4 on a 0-to-10 scale with 0 being no pain and 10 being the worst pain imaginable.  Patient does get occasional sensation that his neck wants to lock up or cramp when he is looking to the right.  Patient is having no radiation of pain to the arms, upper back and no tingling in the fingertips.  Patient with complaint of acute onset within the past 2 weeks of pain in the lower mid back that does not radiate.  Pain is about a 7 on a 0-to-10 scale and can be sharp at times.  Patient has so far used a heating pad to help with the pain.  Pain does not limit every day activities.  Patient has not tried any over-the-counter  medications for pain.  Patient reports that the urinary frequency that he was experiencing at his most recent visit has resolved.  Patient is having no current bladder or bowel issues.  Past Medical History:  Diagnosis Date  . Asthma   . Hepatitis C    Past Surgical History:  Procedure Laterality Date  . NO PAST SURGERIES     Social History   Tobacco Use  . Smoking status: Current Every Day Smoker    Packs/day: 0.10    Types: Cigarettes  . Smokeless tobacco: Never Used  Substance Use Topics  . Alcohol use: No  . Drug use: Yes    Types: Marijuana   Allergies  Allergen Reactions  . Penicillins   . Sulfa Antibiotics Other (See Comments)    Unknown     Observations/Objective: Review of Systems  Constitutional: Negative for chills and fever.  HENT: Negative for congestion and sore throat.   Eyes: Negative for blurred vision, double vision and photophobia.  Respiratory: Negative for cough.   Cardiovascular: Negative for chest pain and palpitations.  Gastrointestinal: Negative for abdominal pain and nausea.  Genitourinary: Negative for dysuria, frequency and urgency.  Musculoskeletal: Positive for back pain and neck pain.  Neurological: Negative for dizziness, focal weakness and headaches.     Assessment and Plan: 1. Neck pain Patient reports that his neck pain is now minimal and more of a pressure sensation.  Patient is not using the Zanaflex on a nightly basis.  Patient reports that he will wait and see if he has any worsening of neck  pain before having any additional imaging such as an MRI scheduled.  Patient does not need a refill of Zanaflex at this time.  Patient will call or return if his symptoms worsen.  2. Muscle spasms of neck Patient reports that neck pain/muscle spasms have improved and he does not need a refill of Zanaflex at this time.  Patient is encouraged to continue use of warm moist heat and take muscle relaxant as needed for muscle spasms.  Call or  return if symptoms worsen.  3. Acute midline low back pain without sciatica Patient is encouraged to try over-the-counter ibuprofen or naproxen twice daily or as per package instructions after meal for 5 to 7 days to see if his back pain subsides.  Patient may also continue the use of a heating pad or warm moist heat to the area.  Patient should call or return if symptoms worsen or do not improve.  Follow Up Instructions:Return if symptoms worsen or fail to improve, for Call or return if symptoms worsen or do not improve.    I discussed the assessment and treatment plan with the patient. The patient was provided an opportunity to ask questions and all were answered. The patient agreed with the plan and demonstrated an understanding of the instructions.   The patient was advised to call back or seek an in-person evaluation if the symptoms worsen or if the condition fails to improve as anticipated.  I provided 12 minutes of non-face-to-face time during this encounter.   Cain Saupe, MD

## 2018-07-10 NOTE — Progress Notes (Signed)
Patient verified DOB Patient has not taken any medication today. Patient has eaten today. Patient states head concern is more of pressure in the top of the neck back of the head.  Patient complains of pain at a 5. Denies any blurred vision or dizziness.Marland Kitchen

## 2018-07-29 ENCOUNTER — Other Ambulatory Visit: Payer: Self-pay | Admitting: Pharmacist

## 2018-07-29 DIAGNOSIS — M62838 Other muscle spasm: Secondary | ICD-10-CM

## 2018-07-29 MED ORDER — TIZANIDINE HCL 4 MG PO CAPS: ORAL_CAPSULE | ORAL | 0 refills | Status: DC

## 2018-09-09 ENCOUNTER — Ambulatory Visit: Payer: Self-pay | Admitting: Family Medicine

## 2018-09-10 ENCOUNTER — Ambulatory Visit: Payer: Self-pay | Attending: Family Medicine | Admitting: Physician Assistant

## 2018-09-10 ENCOUNTER — Other Ambulatory Visit: Payer: Self-pay

## 2018-09-10 ENCOUNTER — Ambulatory Visit: Payer: Self-pay | Admitting: Family Medicine

## 2018-09-10 DIAGNOSIS — J4520 Mild intermittent asthma, uncomplicated: Secondary | ICD-10-CM

## 2018-09-10 DIAGNOSIS — M62838 Other muscle spasm: Secondary | ICD-10-CM

## 2018-09-10 MED ORDER — ALBUTEROL SULFATE HFA 108 (90 BASE) MCG/ACT IN AERS: 2.0000 | INHALATION_SPRAY | Freq: Four times a day (QID) | RESPIRATORY_TRACT | 2 refills | Status: DC | PRN

## 2018-09-10 NOTE — Progress Notes (Signed)
Patient ID: Kevin Mack, male   DOB: 1991-02-16, 28 y.o.   MRN: [id number] Virtual Visit via Telephone Note  I connected with Kevin Mack on 09/10/18 at  2:10 PM EDT by telephone and verified that I am speaking with the correct person using two identifiers.   I discussed the limitations, risks, security and privacy concerns of performing an evaluation and management service by telephone and the availability of in person appointments. I also discussed with the patient that there may be a patient responsible charge related to this service. The patient expressed understanding and agreed to proceed.  Patient location:  home My Location:  [facility] office Persons on the call:  Myself and the patient   History of Present Illness: Patient requesting RF On albuterol.  +still smoking.  Says his back and neck pain are much improved although he still gets sharp pains at times.  He does Aeronautical engineer for work.     Observations/Objective:  A&OX3.  TP linear.  Speech is clear.  Reviewed previous xrays with patient from february   Assessment and Plan: 1. Mild intermittent asthma, unspecified whether complicated Stop smoking - albuterol (VENTOLIN HFA) 108 (90 Base) MCG/ACT inhaler; Inhale 2 puffs into the lungs every 6 (six) hours as needed for wheezing or shortness of breath. Any brand is okay  Dispense: 1 Inhaler; Refill: 2  2. Muscle spasms of neck Improving, RICE therapy/heat or ice when flares    Follow Up Instructions: Prn with PCP   I discussed the assessment and treatment plan with the patient. The patient was provided an opportunity to ask questions and all were answered. The patient agreed with the plan and demonstrated an understanding of the instructions.   The patient was advised to call back or seek an in-person evaluation if the symptoms worsen or if the condition fails to improve as anticipated.  I provided 7 minutes of non-face-to-face time during this encounter.   Georgian Co,  PA-C

## 2018-09-23 ENCOUNTER — Telehealth: Payer: Self-pay | Admitting: Family Medicine

## 2018-09-23 NOTE — Telephone Encounter (Signed)
Spoke with jay'a and Dr.Johnson and advised patient to go to the ED

## 2018-09-23 NOTE — Telephone Encounter (Signed)
Patient called stating he is having chest pains since Saturday please follow up.   Advised to go to the UC or ED

## 2019-03-01 ENCOUNTER — Emergency Department (HOSPITAL_COMMUNITY)
Admission: EM | Admit: 2019-03-01 | Discharge: 2019-03-01 | Disposition: A | Discharge status: Home / Self Care | Payer: Self-pay | Attending: Emergency Medicine | Admitting: Emergency Medicine

## 2019-03-01 ENCOUNTER — Other Ambulatory Visit: Payer: Self-pay

## 2019-03-01 ENCOUNTER — Encounter (HOSPITAL_COMMUNITY): Payer: Self-pay

## 2019-03-01 DIAGNOSIS — M542 Cervicalgia: Secondary | ICD-10-CM | POA: Insufficient documentation

## 2019-03-01 DIAGNOSIS — F17210 Nicotine dependence, cigarettes, uncomplicated: Secondary | ICD-10-CM | POA: Insufficient documentation

## 2019-03-01 DIAGNOSIS — G8929 Other chronic pain: Secondary | ICD-10-CM | POA: Insufficient documentation

## 2019-03-01 DIAGNOSIS — J45909 Unspecified asthma, uncomplicated: Secondary | ICD-10-CM | POA: Insufficient documentation

## 2019-03-01 MED ORDER — BUPIVACAINE HCL (PF) 0.5 % IJ SOLN
10.0000 mL | Freq: Once | INTRAMUSCULAR | Status: AC
  Administered 2019-03-01: 10 mL via PERCUTANEOUS
  Filled 2019-03-01: qty 10

## 2019-03-01 NOTE — Discharge Instructions (Addendum)
Please review the attachment on chronic pain and possible referrals.  Please call the office of Dr. Arnoldo Morale to get established for ongoing evaluation and management of your chronic neck and occipital discomfort.  Please return to the ED or seek medical attention should you develop any new or worsening symptoms.

## 2019-03-01 NOTE — ED Notes (Signed)
Patient verbalizes understanding of discharge instructions. Opportunity for questioning and answers were provided. Armband removed by staff, pt discharged from ED.  

## 2019-03-01 NOTE — ED Triage Notes (Signed)
Patient complains of headache with pressure and neck pain x 2 years. Has been seen by multiple practices per patient for same. States that the pain much worse with stress

## 2019-03-01 NOTE — ED Provider Notes (Signed)
.  Nerve Block  Date/Time: 03/01/2019 8:38 PM Performed by: Maudie Flakes, MD Authorized by: Maudie Flakes, MD   Consent:    Consent obtained:  Verbal   Consent given by:  Patient   Risks discussed:  Allergic reaction, bleeding, infection, nerve damage and unsuccessful block   Alternatives discussed:  Alternative treatment Indications:    Indications:  Pain relief Location:    Body area:  Head   Head nerve:  Occipital   Laterality:  Bilateral Pre-procedure details:    Skin preparation:  2% chlorhexidine   Preparation: Patient was prepped and draped in usual sterile fashion   Procedure details (see MAR for exact dosages):    Block needle gauge:  25 G   Anesthetic injected:  Bupivacaine 0.5% WITH epi   Injection procedure:  Anatomic landmarks identified, anatomic landmarks palpated, incremental injection and negative aspiration for blood   Paresthesia:  None Post-procedure details:    Dressing:  None   Outcome:  Pain relieved   Patient tolerance of procedure:  Tolerated well, no immediate complications      Maudie Flakes, MD 03/01/19 2038

## 2019-03-01 NOTE — ED Provider Notes (Signed)
[facility] EMERGENCY DEPARTMENT Provider Note   CSN: [id number] Arrival date & time: 03/01/19  1652     History   Chief Complaint Chief Complaint  Patient presents with  . Headache    HPI Kevin Mack is a 28 y.o. male with PMH significant for IVDA, hepatitis C, and mild intermittent asthma presents to the ED with a 2-year history of occipital headache and "pressure sensation" with radiation down cervical spine.  He reports that he is intermittently dizzy, but denies any neurologic deficits.  He reports that he has been evaluated for his headaches in the past with plain films, but no obvious pathologies or explanation for his symptoms.  He reports that he had initially fallen from a ladder which may or may not have precipitated his ongoing, chronic pain discomfort.  He also endorses occasional nausea which he associates with his pain.  He denies any significant fevers or chills, but endorses occasional night sweats.  He denies any recent illness, sick contacts, chest pain or difficulty breathing, or other symptoms.     HPI  Past Medical History:  Diagnosis Date  . Asthma   . Hepatitis C     Patient Active Problem List   Diagnosis Date Noted  . Mild intermittent asthma 07/10/2018  . Hepatitis C 07/10/2018    Past Surgical History:  Procedure Laterality Date  . NO PAST SURGERIES          Home Medications    Prior to Admission medications   Medication Sig Start Date End Date Taking? Authorizing Provider  albuterol (VENTOLIN HFA) 108 (90 Base) MCG/ACT inhaler Inhale 2 puffs into the lungs every 6 (six) hours as needed for wheezing or shortness of breath. Any brand is okay 09/10/18   Anders Simmonds, PA-C  tiZANidine (ZANAFLEX) 4 MG capsule Once daily at bedtime as needed for muscle spasm Patient not taking: Reported on 09/10/2018 07/29/18   Cain Saupe, MD    Family History Family History  Problem Relation Age of Onset  . Hepatitis C Other      Social History Social History   Tobacco Use  . Smoking status: Current Every Day Smoker    Packs/day: 0.10    Types: Cigarettes  . Smokeless tobacco: Never Used  Substance Use Topics  . Alcohol use: No  . Drug use: Yes    Types: Marijuana     Allergies   Penicillins and Sulfa antibiotics   Review of Systems Review of Systems  All other systems reviewed and are negative.    Physical Exam Updated Vital Signs BP 120/88 (BP Location: Left Arm)   Pulse 78   Temp 98.1 F (36.7 C) (Oral)   Resp 20   SpO2 99%   Physical Exam Vitals signs and nursing note reviewed. Exam conducted with a chaperone present.  Constitutional:      Appearance: Normal appearance.  HENT:     Head: Normocephalic and atraumatic.  Eyes:     General: No scleral icterus.    Conjunctiva/sclera: Conjunctivae normal.  Neck:     Musculoskeletal: Normal range of motion and neck supple. No neck rigidity.     Comments: Mild bilateral paraspinous TTP along cervical spine. Cardiovascular:     Rate and Rhythm: Normal rate and regular rhythm.     Pulses: Normal pulses.     Heart sounds: Normal heart sounds.  Pulmonary:     Effort: Pulmonary effort is normal. No respiratory distress.     Breath sounds: Normal  breath sounds.  Skin:    General: Skin is dry.     Capillary Refill: Capillary refill takes less than 2 seconds.     Comments: No overlying skin changes, erythema, or swelling.  Neurological:     Mental Status: He is alert.     GCS: GCS eye subscore is 4. GCS verbal subscore is 5. GCS motor subscore is 6.     Sensory: No sensory deficit.     Coordination: Coordination normal.     Gait: Gait normal.     Comments: CN II through XII grossly intact.  No focal deficits.  Negative Romberg and cerebellar exams.  Psychiatric:        Mood and Affect: Mood normal.        Behavior: Behavior normal.        Thought Content: Thought content normal.      ED Treatments / Results  Labs (all labs ordered  are listed, but only abnormal results are displayed) Labs Reviewed - No data to display  EKG None  Radiology No results found.  Procedures Procedures (including critical care time)  Medications Ordered in ED Medications  bupivacaine (MARCAINE) 0.5 % injection 10 mL (has no administration in time range)     Initial Impression / Assessment and Plan / ED Course  I have reviewed the triage vital signs and the nursing notes.  Pertinent labs & imaging results that were available during my care of the patient were reviewed by me and considered in my medical decision making (see chart for details).       Patient presents to the ED with chronic cervical discomfort.  Do not believe that it is pain is intracranial as he locates his discomfort primarily on the back of his neck.  He reports that he has tried Flexeril in the past, with little relief.  He also states that he takes ibuprofen and Tylenol, with no relief of the symptoms.  Discussed case with Dr. Sedonia Small who personally evaluated the patient to determine whether or not further imaging was warranted.  His vitals are within normal limits and he denies any recent IVDA.  He is neurologically intact and denies any neurologic symptoms.  Do not suspect brain abscess, epidural abscess, or intracranial lesion as the etiology for his cervical discomfort.  Do not feel as though CT or MRI is necessary at this time.  Instead, would prefer to refer him to neurosurgery for continued evaluation and management of his chronic pain.  We also will encourage him to get established with a pain clinic.  He has a history of IVDA and narcotic abuse and he understands that his options are likely limited.  We provided a nerve block here in the ED, but with little relief.  The procedure was discussed with patient and his girlfriend and they agreed and were aware of the risks and benefits.  The procedure was tolerated without difficulty or complications.  We will  encourage patient to continue alternating ibuprofen and Tylenol at home until he can be further evaluated by a specialist.  We provided a referral to Dr. Arnoldo Morale, per the request.  Also strongly encouraged him to get established with a pain clinic for ongoing management for his symptoms.  Lastly, we discussed conservative methods and lifestyle modifications for symptomatic relief.  Patient and his girlfriend both voiced understanding and are agreeable to plan.   Final Clinical Impressions(s) / ED Diagnoses   Final diagnoses:  Neck pain, chronic    ED Discharge  Orders    None       Elvera MariaGreen, Josanna Hefel L, PA-C 03/01/19 2116    Sabas SousBero, Michael M, MD 03/02/19 2257

## 2019-04-21 ENCOUNTER — Emergency Department (HOSPITAL_COMMUNITY)
Admission: EM | Admit: 2019-04-21 | Discharge: 2019-04-21 | Disposition: A | Discharge status: Home / Self Care | Payer: Self-pay | Attending: Emergency Medicine | Admitting: Emergency Medicine

## 2019-04-21 ENCOUNTER — Other Ambulatory Visit: Payer: Self-pay

## 2019-04-21 ENCOUNTER — Encounter (HOSPITAL_COMMUNITY): Payer: Self-pay

## 2019-04-21 DIAGNOSIS — R55 Syncope and collapse: Secondary | ICD-10-CM | POA: Insufficient documentation

## 2019-04-21 DIAGNOSIS — J45909 Unspecified asthma, uncomplicated: Secondary | ICD-10-CM | POA: Insufficient documentation

## 2019-04-21 DIAGNOSIS — F17210 Nicotine dependence, cigarettes, uncomplicated: Secondary | ICD-10-CM | POA: Insufficient documentation

## 2019-04-21 DIAGNOSIS — F1210 Cannabis abuse, uncomplicated: Secondary | ICD-10-CM | POA: Insufficient documentation

## 2019-04-21 DIAGNOSIS — Z79899 Other long term (current) drug therapy: Secondary | ICD-10-CM | POA: Insufficient documentation

## 2019-04-21 LAB — CBC WITH DIFFERENTIAL/PLATELET
Abs Immature Granulocytes: 0.05 10*3/uL (ref 0.00–0.07)
Basophils Absolute: 0.1 10*3/uL (ref 0.0–0.1)
Basophils Relative: 1 %
Eosinophils Absolute: 0.3 10*3/uL (ref 0.0–0.5)
Eosinophils Relative: 2 %
HCT: 46.4 % (ref 39.0–52.0)
Hemoglobin: 15.6 g/dL (ref 13.0–17.0)
Immature Granulocytes: 0 %
Lymphocytes Relative: 23 %
Lymphs Abs: 3 10*3/uL (ref 0.7–4.0)
MCH: 29.5 pg (ref 26.0–34.0)
MCHC: 33.6 g/dL (ref 30.0–36.0)
MCV: 87.7 fL (ref 80.0–100.0)
Monocytes Absolute: 0.8 10*3/uL (ref 0.1–1.0)
Monocytes Relative: 6 %
Neutro Abs: 8.8 10*3/uL — ABNORMAL HIGH (ref 1.7–7.7)
Neutrophils Relative %: 68 %
Platelets: 259 10*3/uL (ref 150–400)
RBC: 5.29 MIL/uL (ref 4.22–5.81)
RDW: 12.6 % (ref 11.5–15.5)
WBC: 13 10*3/uL — ABNORMAL HIGH (ref 4.0–10.5)
nRBC: 0 % (ref 0.0–0.2)

## 2019-04-21 LAB — BASIC METABOLIC PANEL
Anion gap: 10 (ref 5–15)
BUN: 15 mg/dL (ref 6–20)
CO2: 25 mmol/L (ref 22–32)
Calcium: 9.4 mg/dL (ref 8.9–10.3)
Chloride: 106 mmol/L (ref 98–111)
Creatinine, Ser: 0.94 mg/dL (ref 0.61–1.24)
GFR calc Af Amer: >60 mL/min (ref >60)
GFR calc non Af Amer: >60 mL/min (ref >60)
Glucose, Bld: 124 mg/dL — ABNORMAL HIGH (ref 70–99)
Potassium: 3.6 mmol/L (ref 3.5–5.1)
Sodium: 141 mmol/L (ref 135–145)

## 2019-04-21 LAB — TROPONIN I (HIGH SENSITIVITY): Troponin I (High Sensitivity): 3 ng/L (ref <18)

## 2019-04-21 LAB — MAGNESIUM: Magnesium: 2.2 mg/dL (ref 1.7–2.4)

## 2019-04-21 MED ORDER — SODIUM CHLORIDE 0.9 % IV BOLUS (SEPSIS)
1000.0000 mL | Freq: Once | INTRAVENOUS | Status: AC
  Administered 2019-04-21: 1000 mL via INTRAVENOUS

## 2019-04-21 MED ORDER — SODIUM CHLORIDE 0.9 % IV SOLN
1000.0000 mL | INTRAVENOUS | Status: DC
  Administered 2019-04-21: 23:00:00 1000 mL via INTRAVENOUS

## 2019-04-21 NOTE — ED Provider Notes (Signed)
[facility] EMERGENCY DEPARTMENT Provider Note   CSN: [id number] Arrival date & time: 04/21/19  2123     History Chief Complaint  Patient presents with  . Loss of Consciousness    Kevin Mack is a 29 y.o. male.  HPI   Patient presents to the emergency room for evaluation after a syncopal episode.  Patient states he had a traumatic personal event today.  His father passed away.  He was at his father's home dealing with that issue for the most of the day.  He went to [facility] and got something to eat on the way home.  He went to go to the bathroom felt like he could not go.  He started to become diaphoretic and lightheaded.  Patient lost consciousness.  The next thing he remembers is his girlfriend who then called EMS.  EMS reported the patient was unconscious for maybe a couple of minutes.  The patient states his girlfriend told him maybe 30 minutes.  Patient denies any chest pain or headache.  Denies any numbness or weakness.  No abdominal pain.  Past Medical History:  Diagnosis Date  . Asthma   . Hepatitis C     Patient Active Problem List   Diagnosis Date Noted  . Mild intermittent asthma 07/10/2018  . Hepatitis C 07/10/2018    Past Surgical History:  Procedure Laterality Date  . NO PAST SURGERIES         Family History  Problem Relation Age of Onset  . Hepatitis C Other     Social History   Tobacco Use  . Smoking status: Current Every Day Smoker    Packs/day: 0.10    Types: Cigarettes  . Smokeless tobacco: Never Used  Substance Use Topics  . Alcohol use: No  . Drug use: Yes    Types: Marijuana    Home Medications Prior to Admission medications   Medication Sig Start Date End Date Taking? Authorizing Provider  albuterol (VENTOLIN HFA) 108 (90 Base) MCG/ACT inhaler Inhale 2 puffs into the lungs every 6 (six) hours as needed for wheezing or shortness of breath. Any brand is okay 09/10/18   Argentina Donovan, PA-C  tiZANidine (ZANAFLEX)  4 MG capsule Once daily at bedtime as needed for muscle spasm Patient not taking: Reported on 09/10/2018 07/29/18   Antony Blackbird, MD    Allergies    Penicillins and Sulfa antibiotics  Review of Systems   Review of Systems  All other systems reviewed and are negative.   Physical Exam Updated Vital Signs BP 114/71   Pulse 77   Temp 97.8 F (36.6 C) (Oral)   Resp 19   SpO2 98%   Physical Exam Vitals and nursing note reviewed.  Constitutional:      General: He is not in acute distress.    Appearance: He is well-developed.  HENT:     Head: Normocephalic and atraumatic.     Right Ear: External ear normal.     Left Ear: External ear normal.  Eyes:     General: No scleral icterus.       Right eye: No discharge.        Left eye: No discharge.     Conjunctiva/sclera: Conjunctivae normal.  Neck:     Trachea: No tracheal deviation.  Cardiovascular:     Rate and Rhythm: Normal rate and regular rhythm.  Pulmonary:     Effort: Pulmonary effort is normal. No respiratory distress.     Breath sounds: Normal  breath sounds. No stridor. No wheezing or rales.  Abdominal:     General: Bowel sounds are normal. There is no distension.     Palpations: Abdomen is soft.     Tenderness: There is no abdominal tenderness. There is no guarding or rebound.  Musculoskeletal:        General: No tenderness.     Cervical back: Neck supple.  Skin:    General: Skin is warm and dry.     Findings: No rash.  Neurological:     Mental Status: He is alert.     Cranial Nerves: No cranial nerve deficit (no facial droop, extraocular movements intact, no slurred speech).     Sensory: No sensory deficit.     Motor: No abnormal muscle tone or seizure activity.     Coordination: Coordination normal.     ED Results / Procedures / Treatments   Labs (all labs ordered are listed, but only abnormal results are displayed) Labs Reviewed  CBC WITH DIFFERENTIAL/PLATELET - Abnormal; Notable for the following  components:      Result Value   WBC 13.0 (*)    Neutro Abs 8.8 (*)    All other components within normal limits  BASIC METABOLIC PANEL - Abnormal; Notable for the following components:   Glucose, Bld 124 (*)    All other components within normal limits  MAGNESIUM  TROPONIN I (HIGH SENSITIVITY)    EKG EKG Interpretation  Date/Time:  Tuesday April 21 2019 21:45:02 EST Ventricular Rate:  73 PR Interval:    QRS Duration: 125 QT Interval:  372 QTC Calculation: 410 R Axis:   107 Text Interpretation: Sinus rhythm Probable left atrial enlargement RBBB and LPFB Repol abnrm suggests ischemia, lateral leads ST elevation, consider anterior injury RBBB is new since prior ECG Confirmed by Linwood Dibbles [id number]) on 04/21/2019 10:04:44 PM   Radiology No results found.  Procedures Procedures (including critical care time)  Medications Ordered in ED Medications  sodium chloride 0.9 % bolus 1,000 mL (0 mLs Intravenous Stopped 04/21/19 2248)    Followed by  0.9 %  sodium chloride infusion (0 mLs Intravenous Stopped 04/21/19 2331)    ED Course  I have reviewed the triage vital signs and the nursing notes.  Pertinent labs & imaging results that were available during my care of the patient were reviewed by me and considered in my medical decision making (see chart for details).  Clinical Course as of Apr 20 2329  Tue Apr 21, 2019  2258 Labs reviewed.  Elevated wbc , electrolyte normal   [JK]    Clinical Course User Index [JK] Linwood Dibbles, MD   MDM Rules/Calculators/A&P                      Patient presented the ED for a syncopal episode.  Symptoms most consistent with a vasovagal episode.  Patient's ED work-up is reassuring.  Laboratory tests are unremarkable.  Vital signs are stable.  He has not no recurrent episodes.  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  Do think it  is reasonable for him to follow-up with cardiology and I will give him a referral to  [facility] Final Clinical Impression(s) / ED Diagnoses Final diagnoses:  Syncope, unspecified syncope type    Rx / DC Orders ED Discharge Orders    None       Linwood Dibbles, MD 04/21/19 [phone]

## 2019-04-21 NOTE — Discharge Instructions (Signed)
Return to the ED for recurrent episdoes

## 2019-04-21 NOTE — ED Triage Notes (Signed)
Pt BIB [facility] from home. Pt received a phone call this morning that his dad passed away from an OD. Pt and his girlfriend went home grabbed something to eat on the way. Pt laid down for about 10 mins once home and stated his stomach started hurting bad. Pt went to the bathroom to attempt to try to use it. Pt stated nothing came out but he got sweaty and that's all he remembers. Pt's girlfriend stated he was down for about 2-3 minutes and was pale and diaphoretic. Pt denies any pain. Pt states he use to be a drug user but has been clean for 5 years but did admit to smoking THC today.

## 2020-01-15 ENCOUNTER — Other Ambulatory Visit: Payer: Self-pay

## 2020-01-15 ENCOUNTER — Other Ambulatory Visit: Payer: Self-pay | Admitting: Family

## 2020-01-15 ENCOUNTER — Ambulatory Visit: Payer: Self-pay | Attending: Family | Admitting: Family

## 2020-01-15 DIAGNOSIS — F419 Anxiety disorder, unspecified: Secondary | ICD-10-CM

## 2020-01-15 MED ORDER — SERTRALINE HCL 25 MG PO TABS: 25.0000 mg | ORAL_TABLET | Freq: Every day | ORAL | 0 refills | Status: DC

## 2020-01-15 MED ORDER — SERTRALINE HCL 25 MG PO TABS: 50.0000 mg | ORAL_TABLET | Freq: Every day | ORAL | 0 refills | Status: DC

## 2020-01-15 MED FILL — SERTRALINE HCL 25 MG TABLET: 25 | 30 days supply | Qty: 30 | Fill #0

## 2020-01-15 NOTE — Progress Notes (Addendum)
Virtual Visit via Telephone Note  I connected with Kevin Mack, on 01/15/2020 at 4:05 PM by telephone due to the COVID-19 pandemic and verified that I am speaking with the correct person using two identifiers.  Due to current restrictions/limitations of in-office visits due to the COVID-19 pandemic, this scheduled clinical appointment was converted to a telehealth visit.   Consent: I discussed the limitations, risks, security and privacy concerns of performing an evaluation and management service by telephone and the availability of in person appointments. I also discussed with the patient that there may be a patient responsible charge related to this service. The patient expressed understanding and agreed to proceed.  Location of Patient: Home  Location of Provider: [facility]  Persons participating in Telemedicine visit: Dhaval Woo Ricky Stabs, NP Laruth Bouchard, CMA  History of Present Illness: Kevin Mack is a 29 year old male with history of mild intermittent asthma and hepatitis C who presents for anxiety.   1. ANXIETY: Duration:stable Anxious mood: yes  Excessive worrying: no Irritability: yes  Sweating: sometimes  Nausea: no Palpitations:no Hyperventilation: no Panic attacks: yes Agoraphobia: yes  Obscessions/compulsions: yes Depressed mood: yes Anhedonia: yes Weight changes: no Insomnia: no none  Hypersomnia: no  Fatigue/loss of energy: yes  Feelings of worthlessness: yes Feelings of guilt: yes Impaired concentration/indecisiveness: yes Suicidal ideations: no  Crying spells: no Recent Stressors/Life Changes: yes   Relationship problems: yes   Family stress: no     Financial stress: yes    Job stress: no    Recent death/loss: yes, father and brother    Past Medical History:  Diagnosis Date   Asthma    Hepatitis C    Allergies  Allergen Reactions   Penicillins    Sulfa Antibiotics Other (See Comments)    Unknown     Current Outpatient Medications on File Prior to Visit  Medication Sig Dispense Refill   albuterol (VENTOLIN HFA) 108 (90 Base) MCG/ACT inhaler Inhale 2 puffs into the lungs every 6 (six) hours as needed for wheezing or shortness of breath. Any brand is okay 1 Inhaler 2   tiZANidine (ZANAFLEX) 4 MG capsule Once daily at bedtime as needed for muscle spasm (Patient not taking: Reported on 09/10/2018) 30 capsule 0   No current facility-administered medications on file prior to visit.    Observations/Objective: Alert and oriented x 3. Not in acute distress. Physical examination not completed as this is a telemedicine visit.  Assessment and Plan: 1. Anxiety: - Patient with anxiety.  - Denies thoughts of self-harm and suicidal ideation.  - Begin Sertraline for anxiety management.   Avoid driving or hazardous activity until you know how this medication will affect you. Your reactions could be impaired. Dizziness or fainting can cause falls, accidents, or severe injuries.  Common side effects include drowsiness, nausea, constipation, loss of appetite, dry mouth, increased sweating.  Do not consume alcohol or illicit substances while taking Sertraline.  Call your doctor if you have pounding heartbeats or fluttering in your chest, a light-headed feeling like you may pass out, easy bruising/unusal bleeding, vision change, difficult or painful urination, impotence/sexual problems, liver problems (right-sided upper stomach pain, itching, dark urine, yellowing of skin or eyes/jaundice, low levels of sodium in the body (headache, confusion, slurred speech, severe weakness, vomiting, loss of coordination, feeling unsteady), or manic episodes (racing thoughts, increased energy, decreased need for sleep, risk-taking behavior, being agitated, talkative)  Seek medical attention immediately if you have symptoms of serotonin syndrome such  as agitation, hallucinations, fever, sweating, shivering, fast heart  rate, muscle stiffness, twitching, loss of coordination, nausea, vomiting, or diarrhea  Report any new or worsening symptoms to your provider, such as: mood or behavior changes, anxiety, panic attacks, trouble sleeping, or if you feel impulsive, irritable, agitated, hostile, aggressive, restless, hyperactive (mentally or physically), more depressed, or have thoughts about suicide or hurting yourself - Follow-up with primary physician in 6 weeks or sooner if needed. - sertraline (ZOLOFT); Take 1 tablet (25 mg total) by mouth daily; Dispense: 44 tablets; Refills: 0  Follow Up Instructions: Follow-up with primary physician in 6 weeks or sooner if needed.   Patient was given clear instructions to go to Emergency Department or return to medical center if symptoms don't improve, worsen, or new problems develop.The patient verbalized understanding.  I discussed the assessment and treatment plan with the patient. The patient was provided an opportunity to ask questions and all were answered. The patient agreed with the plan and demonstrated an understanding of the instructions.   The patient was advised to call back or seek an in-person evaluation if the symptoms worsen or if the condition fails to improve as anticipated.   I provided 18 minutes total of non-face-to-face time during this encounter including median intraservice time, reviewing previous notes, labs, imaging, medications, management and patient verbalized understanding.    Rema Fendt, NP  [facility] and [facility] [place], [place] [phone]   01/15/2020, 8:36 AM

## 2020-01-15 NOTE — Patient Instructions (Addendum)
Zoloft for anxiety. Follow-up with primary physician in 6 weeks or sooner if needed.  Generalized Anxiety Disorder, Adult Generalized anxiety disorder (GAD) is a mental health disorder. People with this condition constantly worry about everyday events. Unlike normal anxiety, worry related to GAD is not triggered by a specific event. These worries also do not fade or get better with time. GAD interferes with life functions, including relationships, work, and school. GAD can vary from mild to severe. People with severe GAD can have intense waves of anxiety with physical symptoms (panic attacks). What are the causes? The exact cause of GAD is not known. What increases the risk? This condition is more likely to develop in:  Women.  People who have a family history of anxiety disorders.  People who are very shy.  People who experience very stressful life events, such as the death of a loved one.  People who have a very stressful family environment. What are the signs or symptoms? People with GAD often worry excessively about many things in their lives, such as their health and family. They may also be overly concerned about:  Doing well at work.  Being on time.  Natural disasters.  Friendships. Physical symptoms of GAD include:  Fatigue.  Muscle tension or having muscle twitches.  Trembling or feeling shaky.  Being easily startled.  Feeling like your heart is pounding or racing.  Feeling out of breath or like you cannot take a deep breath.  Having trouble falling asleep or staying asleep.  Sweating.  Nausea, diarrhea, or irritable bowel syndrome (IBS).  Headaches.  Trouble concentrating or remembering facts.  Restlessness.  Irritability. How is this diagnosed? Your health care provider can diagnose GAD based on your symptoms and medical history. You will also have a physical exam. The health care provider will ask specific questions about your symptoms, including  how severe they are, when they started, and if they come and go. Your health care provider may ask you about your use of alcohol or drugs, including prescription medicines. Your health care provider may refer you to a mental health specialist for further evaluation. Your health care provider will do a thorough examination and may perform additional tests to rule out other possible causes of your symptoms. To be diagnosed with GAD, a person must have anxiety that:  Is out of his or her control.  Affects several different aspects of his or her life, such as work and relationships.  Causes distress that makes him or her unable to take part in normal activities.  Includes at least three physical symptoms of GAD, such as restlessness, fatigue, trouble concentrating, irritability, muscle tension, or sleep problems. Before your health care provider can confirm a diagnosis of GAD, these symptoms must be present more days than they are not, and they must last for six months or longer. How is this treated? The following therapies are usually used to treat GAD:  Medicine. Antidepressant medicine is usually prescribed for long-term daily control. Antianxiety medicines may be added in severe cases, especially when panic attacks occur.  Talk therapy (psychotherapy). Certain types of talk therapy can be helpful in treating GAD by providing support, education, and guidance. Options include: ? Cognitive behavioral therapy (CBT). People learn coping skills and techniques to ease their anxiety. They learn to identify unrealistic or negative thoughts and behaviors and to replace them with positive ones. ? Acceptance and commitment therapy (ACT). This treatment teaches people how to be mindful as a way to cope with  unwanted thoughts and feelings. ? Biofeedback. This process trains you to manage your body's response (physiological response) through breathing techniques and relaxation methods. You will work with a  therapist while machines are used to monitor your physical symptoms.  Stress management techniques. These include yoga, meditation, and exercise. A mental health specialist can help determine which treatment is best for you. Some people see improvement with one type of therapy. However, other people require a combination of therapies. Follow these instructions at home:  Take over-the-counter and prescription medicines only as told by your health care provider.  Try to maintain a normal routine.  Try to anticipate stressful situations and allow extra time to manage them.  Practice any stress management or self-calming techniques as taught by your health care provider.  Do not punish yourself for setbacks or for not making progress.  Try to recognize your accomplishments, even if they are small.  Keep all follow-up visits as told by your health care provider. This is important. Contact a health care provider if:  Your symptoms do not get better.  Your symptoms get worse.  You have signs of depression, such as: ? A persistently sad, cranky, or irritable mood. ? Loss of enjoyment in activities that used to bring you joy. ? Change in weight or eating. ? Changes in sleeping habits. ? Avoiding friends or family members. ? Loss of energy for normal tasks. ? Feelings of guilt or worthlessness. Get help right away if:  You have serious thoughts about hurting yourself or others. If you ever feel like you may hurt yourself or others, or have thoughts about taking your own life, get help right away. You can go to your nearest emergency department or call:  Your local emergency services (911 in the [place]  A suicide crisis helpline, such as the National Suicide Prevention Lifeline at (907) 330-4474. This is open 24 hours a day. Summary  Generalized anxiety disorder (GAD) is a mental health disorder that involves worry that is not triggered by a specific event.  People with GAD often  worry excessively about many things in their lives, such as their health and family.  GAD may cause physical symptoms such as restlessness, trouble concentrating, sleep problems, frequent sweating, nausea, diarrhea, headaches, and trembling or muscle twitching.  A mental health specialist can help determine which treatment is best for you. Some people see improvement with one type of therapy. However, other people require a combination of therapies. This information is not intended to replace advice given to you by your health care provider. Make sure you discuss any questions you have with your health care provider. Document Revised: 03/15/2017 Document Reviewed: 02/21/2016 Elsevier Patient Education  2020 [employer].

## 2020-03-02 ENCOUNTER — Telehealth: Payer: Self-pay

## 2020-03-02 ENCOUNTER — Ambulatory Visit: Payer: Self-pay | Admitting: Family Medicine

## 2020-03-02 NOTE — Telephone Encounter (Signed)
Att to contact pt to advise that visit will need to be virtual or rescheduled due to provider will be out of ofc this afternoon. No ans lvm to call ofc

## 2020-03-04 ENCOUNTER — Telehealth (INDEPENDENT_AMBULATORY_CARE_PROVIDER_SITE_OTHER): Payer: Self-pay | Admitting: Primary Care

## 2020-03-04 ENCOUNTER — Other Ambulatory Visit: Payer: Self-pay

## 2020-03-04 DIAGNOSIS — Z760 Encounter for issue of repeat prescription: Secondary | ICD-10-CM

## 2020-03-04 DIAGNOSIS — F4323 Adjustment disorder with mixed anxiety and depressed mood: Secondary | ICD-10-CM

## 2020-03-04 NOTE — Progress Notes (Signed)
Telephone Note  I connected with Kevin Mack on 03/04/20 at  9:10 AM EST by telephone and verified that I am speaking with the correct person using two identifiers.  Location: Patient: Home  Provider: Grayce Sessions-  Home I discussed the limitations, risks, security and privacy concerns of performing an evaluation and management service by telephone and the availability of in person appointments. I also discussed with the patient that there may be a patient responsible charge related to this service. The patient expressed understanding and agreed to proceed.   History of Present Illness: Mr. Kevin Mack is a 29 year old male with history of mild intermittent asthma and hepatitis C who presents for  Follow up on anxiety and medication  effectiveness evaluation   Observations/Objective: Alert and oriented x 3. Not in acute distress. Physical examination not completed as this is a telemedicine visit.Marland Kitchen Previous visit was start on Zoloft .requesting refill .  Assessment and Plan: Kevin Mack was seen today for follow-up.  Diagnoses and all orders for this visit:  Adjustment disorder with mixed anxiety and depressed mood Continue Zoloft 25mg  at bedtime can also help with insomnia. Denies thoughts of self-harm and suicidal ideation. Advise to seek medical attention immediately if any symptoms of serotonin syndrome such as agitation, hallucinations, fever, sweating, shivering, fast heart rate, muscle stiffness, twitching, loss of coordination, nausea, vomiting, or diarrhea  Other orders/Medication refill -     sertraline (ZOLOFT) 25 MG tablet; Take 1 tablet (25 mg total) by mouth daily.    Follow Up Instructions:    I discussed the assessment and treatment plan with the patient. The patient was provided an opportunity to ask questions and all were answered. The patient agreed with the plan and demonstrated an understanding of the instructions.   The patient was advised to call back or  seek an in-person evaluation if the symptoms worsen or if the condition fails to improve as anticipated.  I provided 14 minutes of non-face-to-face time during this encounter.   , NP

## 2020-03-13 ENCOUNTER — Encounter (INDEPENDENT_AMBULATORY_CARE_PROVIDER_SITE_OTHER): Payer: Self-pay | Admitting: Primary Care

## 2020-03-13 MED ORDER — SERTRALINE HCL 25 MG PO TABS: 25.0000 mg | ORAL_TABLET | Freq: Every day | ORAL | 0 refills | Status: DC

## 2020-10-11 IMAGING — DX DG CHEST 2V
2 series · 2 of 2 positions shown · non-contrast
Comparison: Chest x-ray of 04/25/2013

CLINICAL DATA: Cough, spitting up blood, night sweats over the last
few weeks, smoking history

EXAM:
CHEST - 2 VIEW

[chest pa]
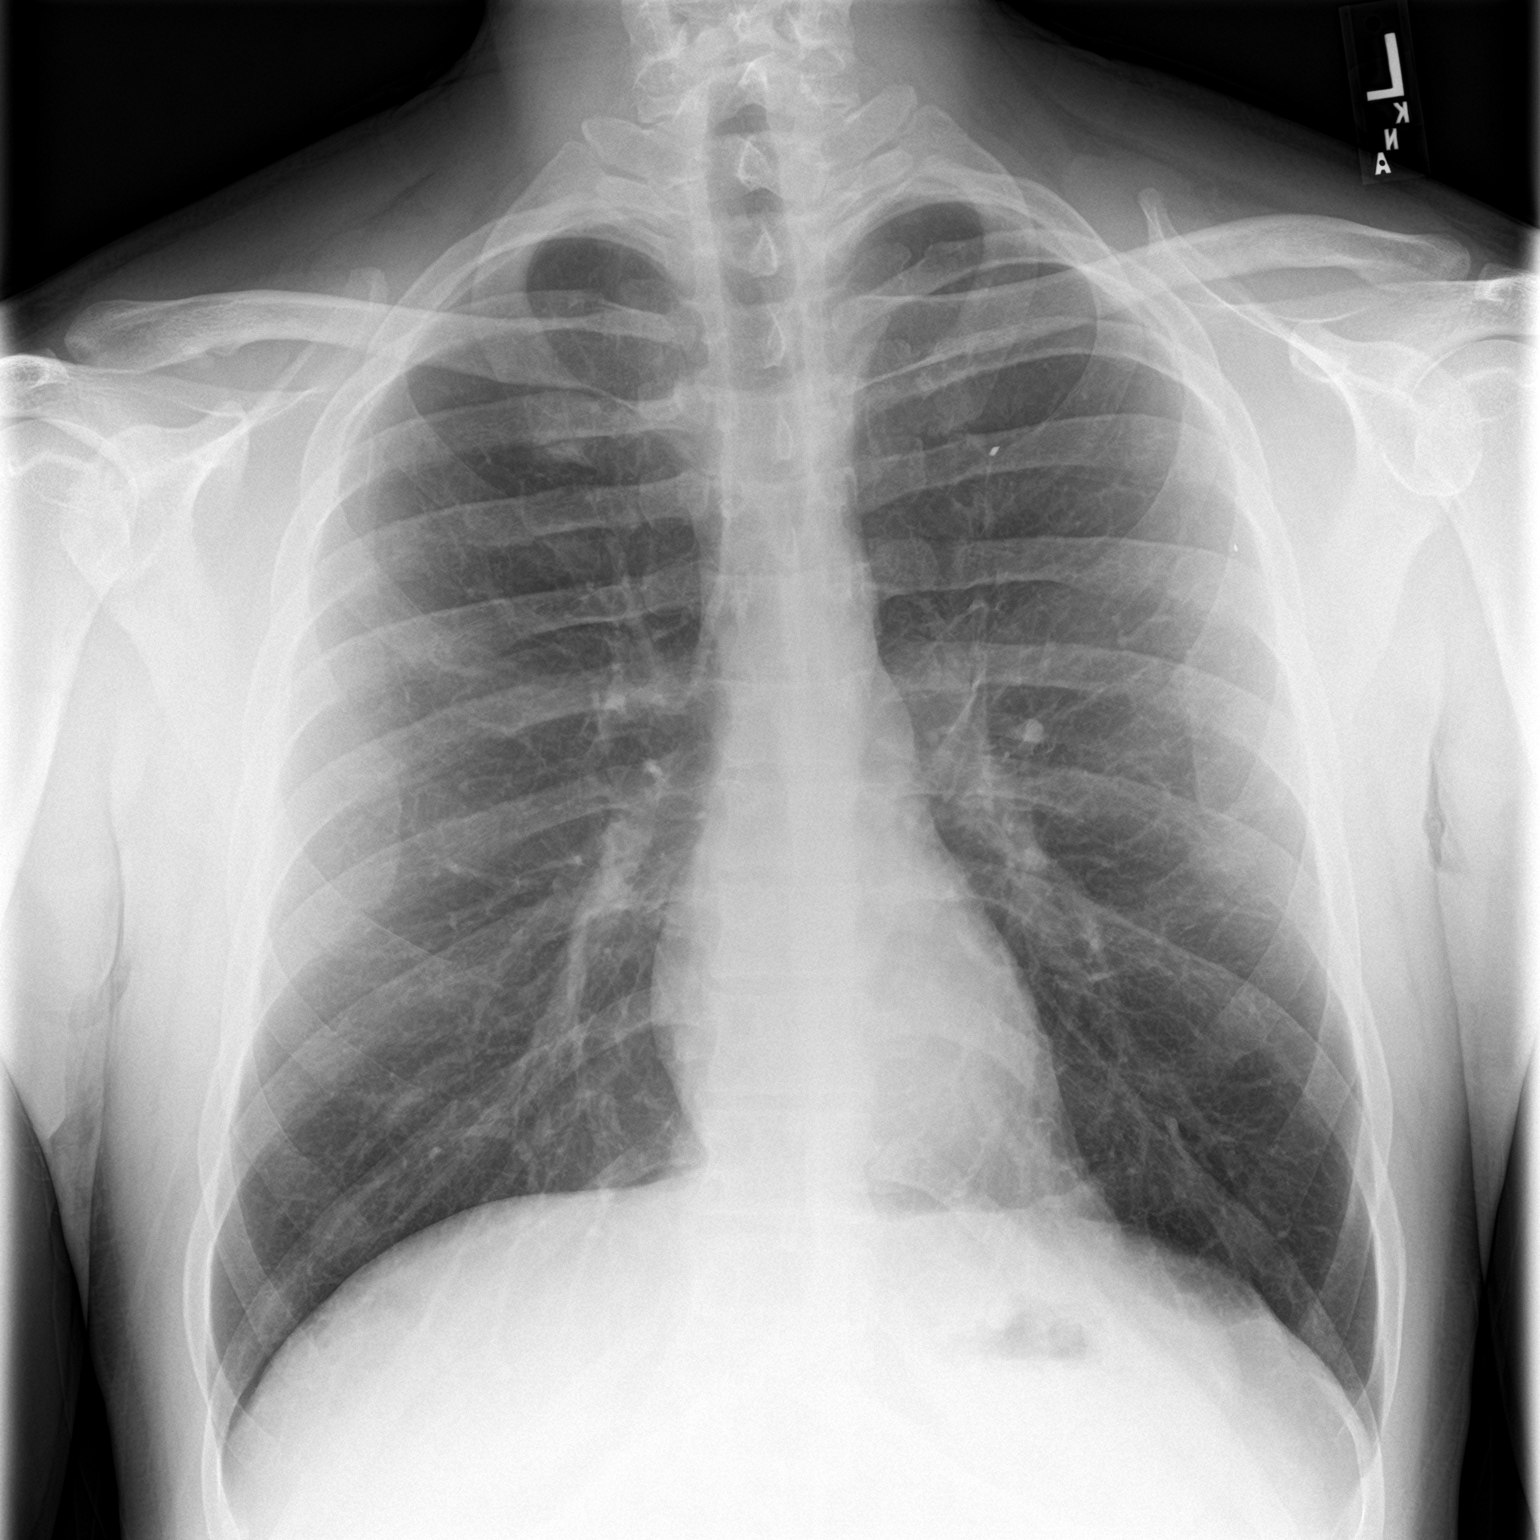

[chest lat]
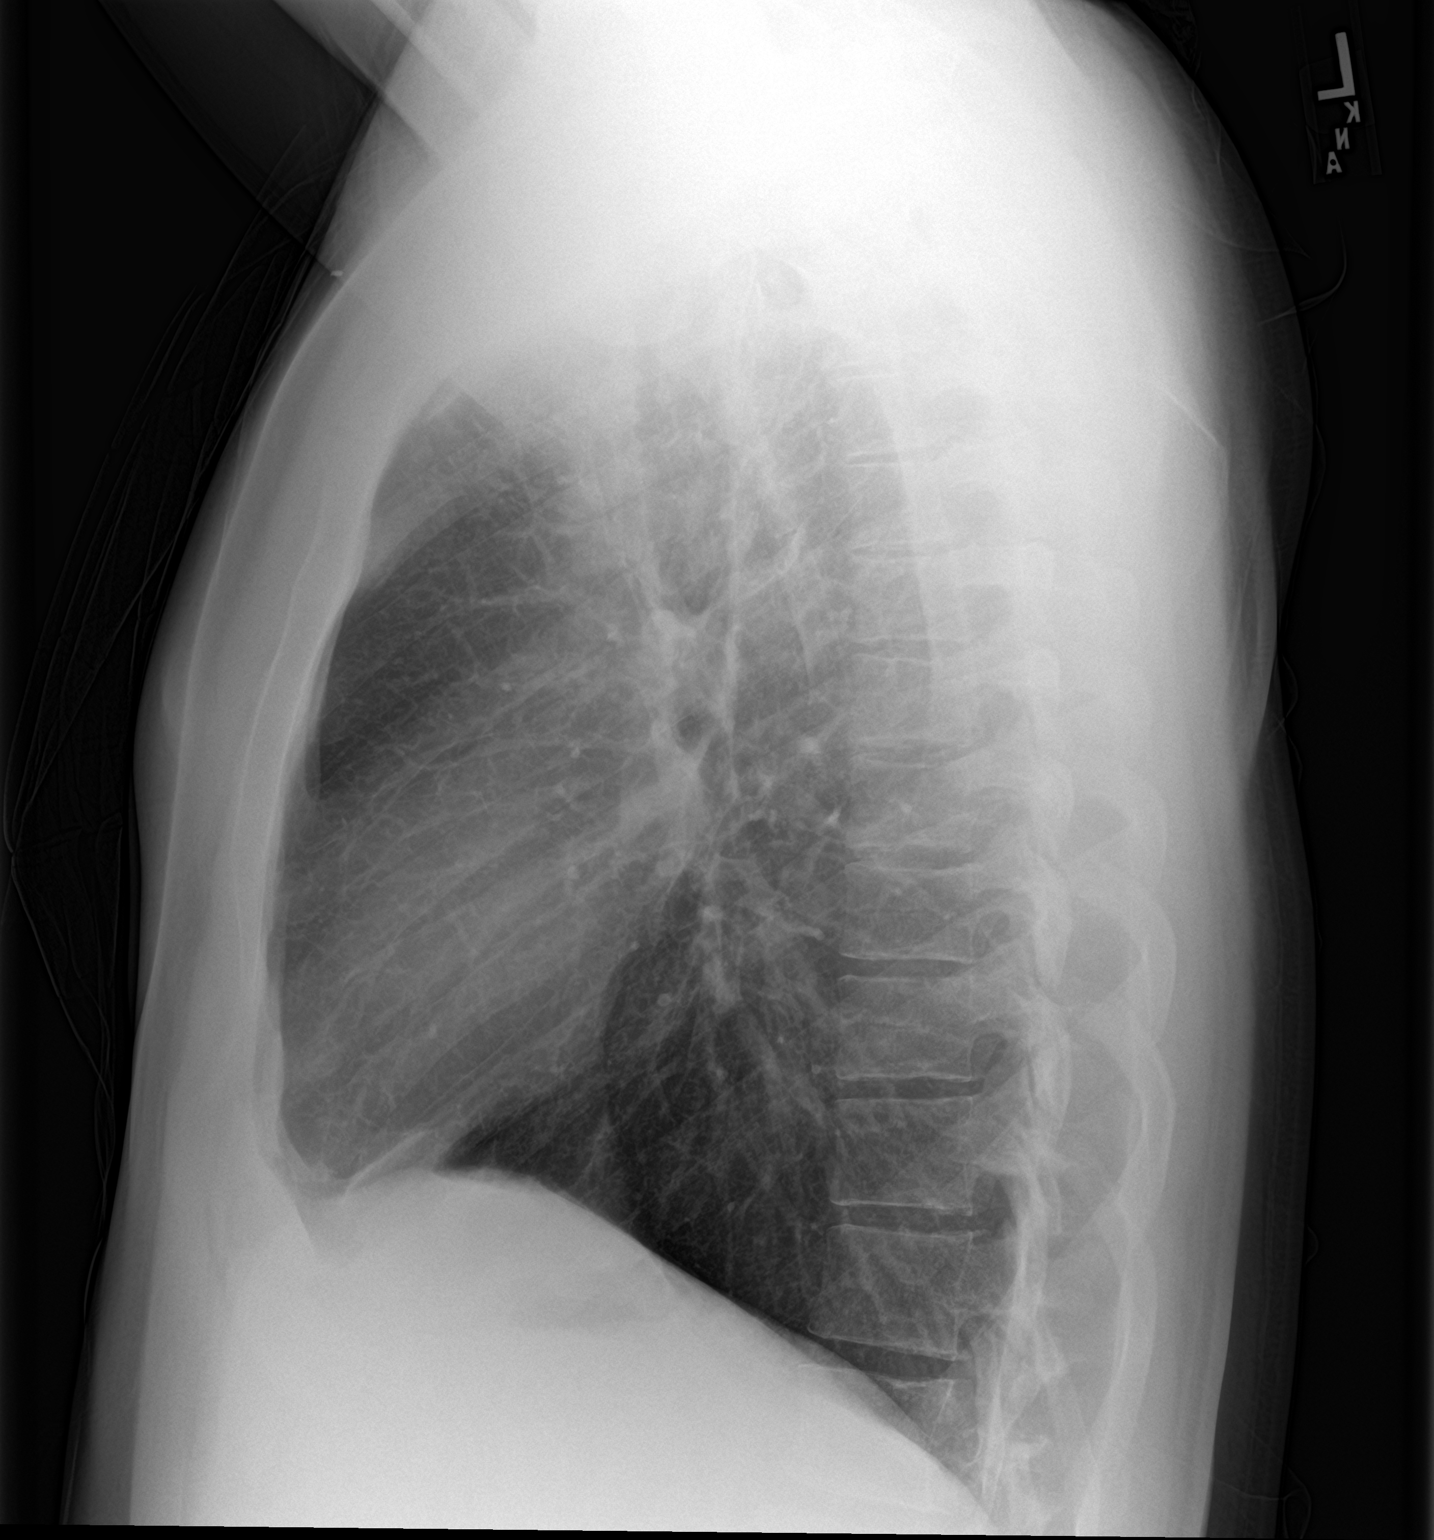

[2 of 2 positions shown; findings below may reference images not displayed]

FINDINGS: No active infiltrate or effusion is seen. Mediastinal and hilar
contours are unremarkable. The heart is within normal limits in
size. No acute bony abnormality is seen.
IMPRESSION: No active cardiopulmonary disease.

## 2020-11-01 ENCOUNTER — Encounter (HOSPITAL_COMMUNITY): Payer: Self-pay | Admitting: Student

## 2020-11-01 ENCOUNTER — Emergency Department (HOSPITAL_COMMUNITY)
Admission: EM | Admit: 2020-11-01 | Discharge: 2020-11-01 | Disposition: A | Discharge status: Home / Self Care | Payer: Self-pay | Attending: Emergency Medicine | Admitting: Emergency Medicine

## 2020-11-01 ENCOUNTER — Emergency Department (HOSPITAL_COMMUNITY): Payer: Self-pay

## 2020-11-01 DIAGNOSIS — R079 Chest pain, unspecified: Secondary | ICD-10-CM

## 2020-11-01 DIAGNOSIS — F17210 Nicotine dependence, cigarettes, uncomplicated: Secondary | ICD-10-CM | POA: Insufficient documentation

## 2020-11-01 DIAGNOSIS — R0789 Other chest pain: Secondary | ICD-10-CM | POA: Insufficient documentation

## 2020-11-01 DIAGNOSIS — R001 Bradycardia, unspecified: Secondary | ICD-10-CM | POA: Insufficient documentation

## 2020-11-01 DIAGNOSIS — J45909 Unspecified asthma, uncomplicated: Secondary | ICD-10-CM | POA: Insufficient documentation

## 2020-11-01 LAB — CBC
HCT: 47.8 % (ref 39.0–52.0)
Hemoglobin: 16 g/dL (ref 13.0–17.0)
MCH: 29.1 pg (ref 26.0–34.0)
MCHC: 33.5 g/dL (ref 30.0–36.0)
MCV: 87.1 fL (ref 80.0–100.0)
Platelets: 177 10*3/uL (ref 150–400)
RBC: 5.49 MIL/uL (ref 4.22–5.81)
RDW: 12.6 % (ref 11.5–15.5)
WBC: 4.3 10*3/uL (ref 4.0–10.5)
nRBC: 0 % (ref 0.0–0.2)

## 2020-11-01 LAB — BASIC METABOLIC PANEL
Anion gap: 5 (ref 5–15)
BUN: 9 mg/dL (ref 6–20)
CO2: 26 mmol/L (ref 22–32)
Calcium: 8.9 mg/dL (ref 8.9–10.3)
Chloride: 107 mmol/L (ref 98–111)
Creatinine, Ser: 0.76 mg/dL (ref 0.61–1.24)
GFR, Estimated: >60 mL/min (ref >60)
Glucose, Bld: 102 mg/dL — ABNORMAL HIGH (ref 70–99)
Potassium: 4 mmol/L (ref 3.5–5.1)
Sodium: 138 mmol/L (ref 135–145)

## 2020-11-01 LAB — TROPONIN I (HIGH SENSITIVITY)
Troponin I (High Sensitivity): 5 ng/L (ref <18)
Troponin I (High Sensitivity): 4 ng/L (ref <18)

## 2020-11-01 MED ORDER — NAPROXEN 250 MG PO TABS
500.0000 mg | ORAL_TABLET | Freq: Once | ORAL | Status: AC
  Administered 2020-11-01: 500 mg via ORAL
  Filled 2020-11-01: qty 2

## 2020-11-01 MED ORDER — METHOCARBAMOL 500 MG PO TABS: 500.0000 mg | ORAL_TABLET | Freq: Three times a day (TID) | ORAL | 0 refills | Status: DC | PRN

## 2020-11-01 MED ORDER — LIDOCAINE 5 % EX PTCH
1.0000 | MEDICATED_PATCH | CUTANEOUS | Status: DC
  Administered 2020-11-01: 1 via TRANSDERMAL
  Filled 2020-11-01: qty 1

## 2020-11-01 MED ORDER — LIDOCAINE 5 % EX PTCH: 1.0000 | MEDICATED_PATCH | Freq: Every day | CUTANEOUS | 0 refills | Status: DC | PRN

## 2020-11-01 MED ORDER — NAPROXEN 500 MG PO TABS: 500.0000 mg | ORAL_TABLET | Freq: Two times a day (BID) | ORAL | 0 refills | Status: DC | PRN

## 2020-11-01 NOTE — ED Triage Notes (Signed)
Pt here by EMS with c/o of chest pain. Burning left side chest pain. 8/10 . 81mg  ASA taken by pt at home with improvement. Pt endorses increased stress.  126/75 HR 63 RR 16 96% RA

## 2020-11-01 NOTE — Discharge Instructions (Addendum)
You were seen in the emergency department today for chest pain. Your work-up in the emergency department has been overall reassuring. Your labs have been fairly normal and or similar to previous blood work you have had done. Your EKG and the enzyme we use to check your heart did not show an acute heart attack at this time. Your chest x-ray was normal.  We are sending you home with the following medicines to treat for possible muscular pain:  - Naproxen is a nonsteroidal anti-inflammatory medication that will help with pain and swelling. Be sure to take this medication as prescribed with food, 1 pill every 12 hours,  It should be taken with food, as it can cause stomach upset, and more seriously, stomach bleeding. Do not take other nonsteroidal anti-inflammatory medications with this such as Advil, Motrin, Aleve, Mobic, Goodie Powder, or Motrin.    - Robaxin is the muscle relaxer I have prescribed, this is meant to help with muscle tightness. Be aware that this medication may make you drowsy therefore the first time you take this it should be at a time you are in an environment where you can rest. Do not drive or operate heavy machinery when taking this medication. Do not drink alcohol or take other sedating medications with this medicine such as narcotics or benzodiazepines.   - Lidoderm patch-this is a patch to help numb/soothe the area, apply 1 patch in area of most significant pain per day, remove and discard patch within 12 hours of application.  We have prescribed you new medication(s) today. Discuss the medications prescribed today with your pharmacist as they can have adverse effects and interactions with your other medicines including over the counter and prescribed medications. Seek medical evaluation if you start to experience new or abnormal symptoms after taking one of these medicines, seek care immediately if you start to experience difficulty breathing, feeling of your throat closing, facial  swelling, or rash as these could be indications of a more serious allergic reaction  We would like you to follow up closely with your primary care provider and/or the cardiologist provided in your discharge instructions within 1-3 days. Return to the ER immediately should you experience any new or worsening symptoms including but not limited to return of pain, worsened pain, vomiting, shortness of breath, dizziness, lightheadedness, passing out, or any other concerns that you may have.
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
## 2020-11-04 ENCOUNTER — Telehealth: Payer: Self-pay | Admitting: Nurse Practitioner

## 2020-11-04 NOTE — Telephone Encounter (Signed)
Called to confirm pt appt for Monday pt stated they will be there

## 2020-11-07 ENCOUNTER — Ambulatory Visit (INDEPENDENT_AMBULATORY_CARE_PROVIDER_SITE_OTHER): Payer: Self-pay | Admitting: Nurse Practitioner

## 2020-11-07 VITALS — BP 118/84 | HR 103 | Temp 97.9°F | Resp 18

## 2020-11-07 DIAGNOSIS — K7460 Unspecified cirrhosis of liver: Secondary | ICD-10-CM

## 2020-11-07 DIAGNOSIS — R0789 Other chest pain: Secondary | ICD-10-CM

## 2020-11-07 DIAGNOSIS — Z8619 Personal history of other infectious and parasitic diseases: Secondary | ICD-10-CM

## 2020-11-07 NOTE — Progress Notes (Signed)
@Patient  ID: , male    DOB: 07/26/1990, 30 y.o.   MRN: [id number]  Chief Complaint  Patient presents with   Hospitalization Follow-up     Referring provider: No ref. provider found    HPI  Patient presents today for transitional care visit.  Patient was seen in the ED on 11/01/2020.  He was seen with chest wall pain.  Chest x-ray and labs were reassuring.  He was treated for musculoskeletal pain.  He was discharged home.  Overall patient is doing well.  He has concerned today because he states that he was told that he did have hepatitis C when he tried to donate plasma a few years ago.  He would like a referral to infectious disease to follow-up on this. Denies f/c/s, n/v/d, hemoptysis, PND, chest pain or edema.        Allergies  Allergen Reactions   Penicillins     unknown   Sulfa Antibiotics Other (See Comments)    Unknown    Immunization History  Administered Date(s) Administered   Tdap 07/21/2013    Past Medical History:  Diagnosis Date   Asthma    Hepatitis C     Tobacco History: Social History   Tobacco Use  Smoking Status Every Day   Packs/day: 0.10   Types: Cigarettes  Smokeless Tobacco Never   Ready to quit: No Counseling given: Yes   Outpatient Encounter Medications as of 11/07/2020  Medication Sig   lidocaine (LIDODERM) 5 % Place 1 patch onto the skin daily as needed. Apply patch to area most significant pain once per day.  Remove and discard patch within 12 hours of application.   methocarbamol (ROBAXIN) 500 MG tablet Take 1 tablet (500 mg total) by mouth every 8 (eight) hours as needed for muscle spasms.   naproxen (NAPROSYN) 500 MG tablet Take 1 tablet (500 mg total) by mouth 2 (two) times daily as needed for moderate pain.   No facility-administered encounter medications on file as of 11/07/2020.     Review of Systems  Review of Systems  Constitutional: Negative.   HENT: Negative.    Respiratory:  Negative for cough and  shortness of breath.   Cardiovascular: Negative.   Gastrointestinal: Negative.   Allergic/Immunologic: Negative.   Neurological: Negative.   Psychiatric/Behavioral: Negative.        Physical Exam  BP 118/84   Pulse (!) 103   Temp 97.9 F (36.6 C)   Resp 18   SpO2 98%   Wt Readings from Last 5 Encounters:  06/19/18 190 lb (86.2 kg)  05/12/18 187 lb (84.8 kg)  04/10/18 193 lb (87.5 kg)  02/04/18 182 lb 6.4 oz (82.7 kg)  01/08/18 180 lb 9.6 oz (81.9 kg)     Physical Exam Vitals and nursing note reviewed.  Constitutional:      General: He is not in acute distress.    Appearance: He is well-developed.  Cardiovascular:     Rate and Rhythm: Normal rate and regular rhythm.  Pulmonary:     Effort: Pulmonary effort is normal.     Breath sounds: Normal breath sounds.  Skin:    General: Skin is warm and dry.  Neurological:     Mental Status: He is alert and oriented to person, place, and time.     Lab Results:  CBC    Component Value Date/Time   WBC 4.3 11/01/2020 0827   RBC 5.49 11/01/2020 0827   HGB 16.0 11/01/2020 0827   HGB 16.1  04/10/2018 1700   HCT 47.8 11/01/2020 0827   HCT 47.3 04/10/2018 1700   PLT 177 11/01/2020 0827   PLT 276 04/10/2018 1700   MCV 87.1 11/01/2020 0827   MCV 86 04/10/2018 1700   MCH 29.1 11/01/2020 0827   MCHC 33.5 11/01/2020 0827   RDW 12.6 11/01/2020 0827   RDW 12.7 04/10/2018 1700   LYMPHSABS 3.0 04/21/2019 2139   LYMPHSABS 3.4 (H) 04/10/2018 1700   MONOABS 0.8 04/21/2019 2139   EOSABS 0.3 04/21/2019 2139   EOSABS 0.3 04/10/2018 1700   BASOSABS 0.1 04/21/2019 2139   BASOSABS 0.1 04/10/2018 1700    BMET    Component Value Date/Time   NA 138 11/01/2020 0827   K 4.0 11/01/2020 0827   CL 107 11/01/2020 0827   CO2 26 11/01/2020 0827   GLUCOSE 102 (H) 11/01/2020 0827   BUN 9 11/01/2020 0827   CREATININE 0.76 11/01/2020 0827   CALCIUM 8.9 11/01/2020 0827   GFRNONAA >60 11/01/2020 0827   GFRAA >60 04/21/2019 2139     BNP No results found for: BNP  ProBNP No results found for: PROBNP  Imaging: DG Chest 2 View  Result Date: 11/01/2020 CLINICAL DATA:  Chest pain. EXAM: CHEST - 2 VIEW COMPARISON:  04/11/2018. FINDINGS: Mediastinum hilar structures normal. Heart size normal. Lungs are clear. No pleural effusion or pneumothorax. Tiny metallic densities again noted over the left chest. No acute bony abnormality. IMPRESSION: No acute cardiopulmonary disease. Electronically Signed   By: Maisie Fus  Register   On: 11/01/2020 09:20     Assessment & Plan:   Chest wall pain Chest wall pain:  Continue prescriptions from ED  Stay active  Stay well hydrated  History of positive hepatitis C found during plasma donation:  Will place referral to Infectious disease for further work up     Ivonne Andrew, NP 11/10/2020

## 2020-11-07 NOTE — Patient Instructions (Signed)
Chest wall pain:  Continue prescriptions from ED  Stay active  Stay well hydrated  History of positive hepatitis C found during plasma donation:  Will place referral to Infectious disease for further work up   Costochondritis  Costochondritis is inflammation of the tissue (cartilage) that connects the ribs to the breastbone (sternum). This causes pain in the front of the chest. The pain usually starts slowlyand involves more than one rib. What are the causes? The exact cause of this condition is not always known. It results from stress on the cartilage where your ribs attach to your sternum. The cause of this stress could be: Chest injury. Exercise or activity, such as lifting. Severe coughing. What increases the risk? You are more likely to develop this condition if you: Are male. Are 46-24 years old. Recently started a new exercise or work activity. Have low levels of vitamin D. Have a condition that makes you cough frequently. What are the signs or symptoms? The main symptom of this condition is chest pain. The pain: Usually starts gradually and can be sharp or dull. Gets worse with deep breathing, coughing, or exercise. Gets better with rest. May be worse when you press on the affected area of your ribs and sternum. How is this diagnosed? This condition is diagnosed based on your symptoms, your medical history, and a physical exam. Your health care provider will check for pain when pressing on your sternum. You may also have tests to rule out other causes of chest pain. These may include: A chest X-ray to check for lung problems. An ECG (electrocardiogram) to see if you have a heart problem that could be causing the pain. An imaging scan to rule out a chest or rib fracture. How is this treated? This condition usually goes away on its own over time. Your health care provider may prescribe an NSAID, such as ibuprofen, to reduce pain and inflammation. Treatment may also  include: Resting and avoiding activities that make pain worse. Applying heat or ice to the area to reduce pain and inflammation. Doing exercises to stretch your chest muscles. If these treatments do not help, your health care provider may inject a numbingmedicine at the sternum-rib connection to help relieve the pain. Follow these instructions at home: Managing pain, stiffness, and swelling     If directed, put ice on the painful area. To do this: Put ice in a plastic bag. Place a towel between your skin and the bag. Leave the ice on for 20 minutes, 2-3 times a day. If directed, apply heat to the affected area as often as told by your health care provider. Use the heat source that your health care provider recommends, such as a moist heat pack or a heating pad. Place a towel between your skin and the heat source. Leave the heat on for 20-30 minutes. Remove the heat if your skin turns bright red. This is especially important if you are unable to feel pain, heat, or cold. You may have a greater risk of getting burned. Activity Rest as told by your health care provider. Avoid activities that make pain worse. This includes any activities that use chest, abdominal, and side muscles. Do not lift anything that is heavier than 10 lb (4.5 kg), or the limit that you are told, until your health care provider says that it is safe. Return to your normal activities as told by your health care provider. Ask your health care provider what activities are safe for you. General instructions  Take over-the-counter and prescription medicines only as told by your health care provider. Keep all follow-up visits as told by your health care provider. This is important. Contact a health care provider if: You have chills or a fever. Your pain does not go away or it gets worse. You have a cough that does not go away. Get help right away if: You have shortness of breath. You have severe chest pain that is not  relieved by medicines, heat, or ice. These symptoms may represent a serious problem that is an emergency. Do not wait to see if the symptoms will go away. Get medical help right away. Call your local emergency services (911 in the [place] Do not drive yourself to the hospital. Summary Costochondritis is inflammation of the tissue (cartilage) that connects the ribs to the breastbone (sternum). This condition causes pain in the front of the chest. Costochondritis results from stress on the cartilage where your ribs attach to your sternum. Treatment may include medicines, rest, heat or ice, and exercises. This information is not intended to replace advice given to you by your health care provider. Make sure you discuss any questions you have with your healthcare provider. Document Revised: 02/13/2019 Document Reviewed: 02/13/2019 Elsevier Patient Education  2022 [employer].

## 2020-11-10 DIAGNOSIS — R0789 Other chest pain: Secondary | ICD-10-CM | POA: Insufficient documentation

## 2020-11-10 DIAGNOSIS — Z8619 Personal history of other infectious and parasitic diseases: Secondary | ICD-10-CM | POA: Insufficient documentation

## 2020-11-10 DIAGNOSIS — K7460 Unspecified cirrhosis of liver: Secondary | ICD-10-CM | POA: Insufficient documentation

## 2020-11-10 NOTE — Assessment & Plan Note (Signed)
Chest wall pain:  Continue prescriptions from ED  Stay active  Stay well hydrated  History of positive hepatitis C found during plasma donation:  Will place referral to Infectious disease for further work up

## 2020-11-23 ENCOUNTER — Other Ambulatory Visit (HOSPITAL_COMMUNITY): Payer: Self-pay

## 2020-11-23 ENCOUNTER — Telehealth: Payer: Self-pay

## 2020-11-23 NOTE — Telephone Encounter (Signed)
[facility] Patient Advocate Encounter ? ?Insurance verification completed.   ? ?The patient is uninsured and will need patient assistance for medication. ? ?We can complete the application and will need to meet with the patient for signatures and income documentation. ? ?Murray Durrell, CPhT ?Specialty Pharmacy Patient Advocate ?[facility] ?Phone: [phone] ?Fax:  [phone]  ?

## 2020-11-28 ENCOUNTER — Encounter: Payer: Self-pay | Admitting: Family

## 2020-12-26 ENCOUNTER — Encounter: Payer: Self-pay | Admitting: Family

## 2020-12-26 ENCOUNTER — Other Ambulatory Visit: Payer: Self-pay

## 2020-12-26 ENCOUNTER — Ambulatory Visit (INDEPENDENT_AMBULATORY_CARE_PROVIDER_SITE_OTHER): Payer: Self-pay | Admitting: Family

## 2020-12-26 VITALS — BP 122/76 | HR 71 | Temp 98.4°F | Ht 71.0 in | Wt 176.0 lb

## 2020-12-26 DIAGNOSIS — K7460 Unspecified cirrhosis of liver: Secondary | ICD-10-CM

## 2020-12-26 DIAGNOSIS — B182 Chronic viral hepatitis C: Secondary | ICD-10-CM

## 2020-12-26 NOTE — Assessment & Plan Note (Signed)
Mr. Breaker previously diagnosed with cirrhosis of the liver without ascites.  May need ultrasound to ensure no hepatocellular carcinoma or other lesions.  Follow-up pending blood work results.

## 2020-12-26 NOTE — Assessment & Plan Note (Signed)
Kevin Mack is a 30 year old Caucasian gentleman history of with chronic hepatitis C with risk factor of IV drug use and tattoos.  Asymptomatic and treatment nave.  We reviewed the basics of hepatitis C including transmission, risks if left untreated, and treatment options.  Check lab work today including hepatitis C genotype, RNA level, and hepatitis B and HIV status.  He will need financial assistance with medication assistance paperwork completed with pharmacy staff and lab work assistance provided by [employer].  Plan for follow-up 1 month after starting medications pending blood work results.

## 2020-12-26 NOTE — Progress Notes (Signed)
Subjective:    Patient ID: Kevin Mack, male    DOB: [birth year], 30 y.o.   MRN: [id number]  Chief Complaint  Patient presents with   New Patient (Initial Visit)    HPI:  Kedar Sedano is a 30 y.o. 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  Mr. Beamer was initially diagnosed with hepatitis C when giving plasma around 2015.  Risk factors for hepatitis C include IV drug use and homemade tattoo.  Has not received treatment since initial diagnosis.  No current symptoms and denies abdominal pain, nausea, vomiting, fatigue, scleral icterus, or jaundice.  No personal history of liver disease and believes his uncle has liver cancer.  No current recreational or illicit drug use or alcohol consumption.  Smokes approximately three-quarter of a pack of cigarettes per day. 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    Allergies  Allergen Reactions   Penicillins     unknown   Sulfa Antibiotics Other (See Comments)    Unknown      Outpatient Medications Prior to Visit  Medication Sig Dispense Refill   lidocaine (LIDODERM) 5 % Place 1 patch onto the skin daily as needed. Apply patch to area most significant pain once per day.  Remove and discard patch within 12 hours of application. (Patient not taking: Reported on 12/26/2020) 30 patch 0   methocarbamol (ROBAXIN) 500 MG tablet Take 1 tablet (500 mg total) by mouth every 8 (eight) hours as needed for muscle spasms. (Patient not taking: Reported on 12/26/2020) 15 tablet 0   naproxen (NAPROSYN) 500 MG tablet Take 1 tablet (500 mg total) by mouth 2 (two) times daily as needed for moderate pain. (Patient not taking: Reported on 12/26/2020) 15 tablet 0   No facility-administered medications prior to visit.     Past Medical History:  Diagnosis Date   Asthma    Hepatitis C    Substance abuse (HCC)       Past Surgical History:   Procedure Laterality Date   NO PAST SURGERIES        Family History  Problem Relation Age of Onset   Hepatitis C Other       Social History   Socioeconomic History   Marital status: Single    Spouse name: Not on file   Number of children: Not on file   Years of education: Not on file   Highest education level: Not on file  Occupational History   Not on file  Tobacco Use   Smoking status: Every Day    Packs/day: 0.75    Types: Cigarettes   Smokeless tobacco: Never  Vaping Use   Vaping Use: Former  Substance and Sexual Activity   Alcohol use: No   Drug use: Not Currently    Types: Marijuana   Sexual activity: Yes  Other Topics Concern   Not on file  Social History Narrative   Not on file   Social Determinants of Health   Financial Resource Strain: Not on file  Food Insecurity: Not on file  Transportation Needs: Not on file  Physical Activity: Not on file  Stress: Not on file  Social Connections: Not on file  Intimate Partner Violence: Not on file    Review of Systems  Constitutional:  Negative for chills, diaphoresis, fatigue and fever.  Respiratory:  Negative for cough, chest tightness, shortness of breath and wheezing.   Cardiovascular:  Negative for chest pain.  Gastrointestinal:  Negative for abdominal distention, abdominal pain, constipation, diarrhea, nausea and vomiting.  Neurological:  Negative for weakness and headaches.  Hematological:  Does not bruise/bleed easily.      Objective:    BP 122/76   Pulse 71   Temp 98.4 F (36.9 C) (Oral)   Ht 5\' 11"  (1.803 m)   Wt 176 lb (79.8 kg)   SpO2 96%   BMI 24.55 kg/m  Nursing note and vital signs reviewed.  Physical Exam Constitutional:      General: He is not in acute distress.    Appearance: He is well-developed.  Cardiovascular:     Rate and Rhythm: Normal rate and regular rhythm.     Heart sounds: Normal heart sounds. No murmur heard.   No friction rub. No gallop.  Pulmonary:      Effort: Pulmonary effort is normal. No respiratory distress.     Breath sounds: Normal breath sounds. No wheezing or rales.  Chest:     Chest wall: No tenderness.  Abdominal:     General: Bowel sounds are normal. There is no distension.     Palpations: Abdomen is soft. There is no mass.     Tenderness: There is no abdominal tenderness. There is no guarding or rebound.  Skin:    General: Skin is warm and dry.  Neurological:     Mental Status: He is alert and oriented to person, place, and time.  Psychiatric:        Behavior: Behavior normal.        Thought Content: Thought content normal.        Judgment: Judgment normal.        Assessment & Plan:   Patient Active Problem List   Diagnosis Date Noted   Cirrhosis of liver without ascites (HCC) 11/10/2020   History of positive hepatitis C 11/10/2020   Chest wall pain 11/10/2020   Mild intermittent asthma 07/10/2018   Hepatitis C 07/10/2018     Problem List Items Addressed This Visit       Digestive   Hepatitis C - Primary    Mr. Oestreicher is a 30 year old Caucasian gentleman history of with chronic hepatitis C with risk factor of IV drug use and tattoos.  Asymptomatic and treatment nave.  We reviewed the basics of hepatitis C including transmission, risks if left untreated, and treatment options.  Check lab work today including hepatitis C genotype, RNA level, and hepatitis B and HIV status.  He will need financial assistance with medication assistance paperwork completed with pharmacy staff and lab work assistance provided by 26.  Plan for follow-up 1 month after starting medications pending blood work results.      Relevant Orders   Hepatitis B surface antibody,qualitative   Hepatitis B surface antigen   Hepatitis C RNA quantitative   Liver Fibrosis, FibroTest-ActiTest   Protime-INR   Hepatitis C genotype   Hepatitis C antibody   Hepatic function panel   CBC   HIV Antibody (routine testing w rflx)   Cirrhosis of  liver without ascites [facility])    Mr. Stucky previously diagnosed with cirrhosis of the liver without ascites.  May need ultrasound to ensure no hepatocellular carcinoma or other lesions.  Follow-up pending blood work results.        I have discontinued Kyson Rayborn's naproxen, methocarbamol, and lidocaine.   Follow-up: 1 month after starting medication.    Alferd Patee, MSN, FNP-C Nurse Practitioner [facility]  [place] number: [phone]

## 2020-12-26 NOTE — Patient Instructions (Signed)
Nice to see you.  We will check your lab work today and let you know the results.  Plan for follow up in 1 month after starting medication if needed.   Have a great day and stay safe.   Limit acetaminophen (Tylenol) usage to no more than 2 grams (2,000 mg) per day.  Avoid alcohol.  Do not share toothbrushes or razors.  Practice safe sex to protect against transmission as well as sexually transmitted disease.    Hepatitis C Hepatitis C is a viral infection of the liver. It can lead to scarring of the liver (cirrhosis), liver failure, or liver cancer. Hepatitis C may go undetected for months or years because people with the infection may not have symptoms, or they may have only mild symptoms. What are the causes? This condition is caused by the hepatitis C virus (HCV). The virus can spread from person to person (is contagious) through: Blood. Childbirth. A woman who has hepatitis C can pass it to her baby during birth. Bodily fluids, such as breast milk, tears, semen, vaginal fluids, and saliva. Blood transfusions or organ transplants done in the [place] before 1992.  What increases the risk? The following factors may make you more likely to develop this condition: Having contact with unclean (contaminated) needles or syringes. This may result from: Acupuncture. Tattoing. Body piercing. Injecting drugs. Having unprotected sex with someone who is infected. Needing treatment to filter your blood (kidney dialysis). Having HIV (human immunodeficiency virus) or AIDS (acquired immunodeficiency syndrome). Working in a job that involves contact with blood or bodily fluids, such as health care.  What are the signs or symptoms? Symptoms of this condition include: Fatigue. Loss of appetite. Nausea. Vomiting. Abdominal pain. Dark yellow urine. Yellowish skin and eyes (jaundice). Itchy skin. Clay-colored bowel movements. Joint pain. Bleeding and bruising easily. Fluid  building up in your stomach (ascites).  In some cases, you may not have any symptoms. How is this diagnosed? This condition is diagnosed with: Blood tests. Other tests to check how well your liver is functioning. They may include: Magnetic resonance elastography (MRE). This imaging test uses MRIs and sound waves to measure liver stiffness. Transient elastography. This imaging test uses ultrasounds to measure liver stiffness. Liver biopsy. This test requires taking a small tissue sample from your liver to examine it under a microscope.  How is this treated? Your health care provider may perform noninvasive tests or a liver biopsy to help decide the best course of treatment. Treatment may include: Antiviral medicines and other medicines. Follow-up treatments every 6-12 months for infections or other liver conditions. Receiving a donated liver (liver transplant).  Follow these instructions at home: Medicines Take over-the-counter and prescription medicines only as told by your health care provider. Take your antiviral medicine as told by your health care provider. Do not stop taking the antiviral even if you start to feel better. Do not take any medicines unless approved by your health care provider, including over-the-counter medicines and birth control pills. Activity Rest as needed. Do not have sex unless approved by your health care provider. Ask your health care provider when you may return to school or work. Eating and drinking Eat a balanced diet with plenty of fruits and vegetables, whole grains, and lowfat (lean) meats or non-meat proteins (such as beans or tofu). Drink enough fluids to keep your urine clear or pale yellow. Do not drink alcohol. General instructions Do not share toothbrushes, nail clippers, or razors. Wash your hands frequently with  soap and water. If soap and water are not available, use hand sanitizer. Cover any cuts or open sores on your skin to prevent  spreading the virus. Keep all follow-up visits as told by your health care provider. This is important. You may need follow-up visits every 6-12 months. How is this prevented? There is no vaccine for hepatitis C. The only way to prevent the disease is to reduce the risk of exposure to the virus. Make sure you: Wash your hands frequently with soap and water. If soap and water are not available, use hand sanitizer. Do not share needles or syringes. Practice safe sex and use condoms. Avoid handling blood or bodily fluids without gloves or other protection. Avoid getting tattoos or piercings in shops or other locations that are not clean.  Contact a health care provider if: You have a fever. You develop abdominal pain. You pass dark urine. You pass clay-colored stools. You develop joint pain. Get help right away if: You have increasing fatigue or weakness. You lose your appetite. You cannot eat or drink without vomiting. You develop jaundice or your jaundice gets worse. You bruise or bleed easily. Summary Hepatitis C is a viral infection of the liver. It can lead to scarring of the liver (cirrhosis), liver failure, or liver cancer. The hepatitis C virus (HCV) causes this condition. The virus can pass from person to person (is contagious). You should not take any medicines unless approved by your health care provider. This includes over-the-counter medicines and birth control pills. This information is not intended to replace advice given to you by your health care provider. Make sure you discuss any questions you have with your health care provider. Document Released: 03/30/2000 Document Revised: 05/08/2016 Document Reviewed: 05/08/2016 Elsevier Interactive Patient Education  [employer].

## 2021-01-05 LAB — LIVER FIBROSIS, FIBROTEST-ACTITEST
ALT: 14 U/L (ref 9–46)
Alpha-2-Macroglobulin: 186 mg/dL (ref 106–279)
Apolipoprotein A1: 138 mg/dL (ref 94–176)
Bilirubin: 0.9 mg/dL (ref 0.2–1.2)
Fibrosis Score: 0.12
GGT: 9 U/L (ref 3–90)
Haptoglobin: 144 mg/dL (ref 43–212)
Necroinflammat ACT Score: 0.03
Reference ID: 4033229

## 2021-01-05 LAB — HEPATIC FUNCTION PANEL
AG Ratio: 2 (calc) (ref 1.0–2.5)
ALT: 12 U/L (ref 9–46)
AST: 16 U/L (ref 10–40)
Albumin: 4.7 g/dL (ref 3.6–5.1)
Alkaline phosphatase (APISO): 75 U/L (ref 36–130)
Bilirubin, Direct: 0.2 mg/dL (ref 0.0–0.2)
Globulin: 2.3 g/dL (calc) (ref 1.9–3.7)
Indirect Bilirubin: 0.9 mg/dL (calc) (ref 0.2–1.2)
Total Bilirubin: 1.1 mg/dL (ref 0.2–1.2)
Total Protein: 7 g/dL (ref 6.1–8.1)

## 2021-01-05 LAB — CBC
HCT: 46.9 % (ref 38.5–50.0)
Hemoglobin: 15.4 g/dL (ref 13.2–17.1)
MCH: 29.1 pg (ref 27.0–33.0)
MCHC: 32.8 g/dL (ref 32.0–36.0)
MCV: 88.5 fL (ref 80.0–100.0)
MPV: 10.5 fL (ref 7.5–12.5)
Platelets: 259 10*3/uL (ref 140–400)
RBC: 5.3 10*6/uL (ref 4.20–5.80)
RDW: 12.4 % (ref 11.0–15.0)
WBC: 8.7 10*3/uL (ref 3.8–10.8)

## 2021-01-05 LAB — HEPATITIS C ANTIBODY
Hepatitis C Ab: REACTIVE — AB
SIGNAL TO CUT-OFF: 32 — ABNORMAL HIGH (ref <1.00)

## 2021-01-05 LAB — HEPATITIS C RNA QUANTITATIVE
HCV Quantitative Log: <1.18 log IU/mL
HCV RNA, PCR, QN: <15 IU/mL

## 2021-01-05 LAB — HEPATITIS C GENOTYPE: HCV Genotype: NOT DETECTED

## 2021-01-05 LAB — HEPATITIS B SURFACE ANTIGEN: Hepatitis B Surface Ag: NONREACTIVE

## 2021-01-05 LAB — HEPATITIS B SURFACE ANTIBODY,QUALITATIVE: Hep B S Ab: NONREACTIVE

## 2021-01-05 LAB — PROTIME-INR
INR: 1
Prothrombin Time: 10.3 s (ref 9.0–11.5)

## 2021-01-05 LAB — HIV ANTIBODY (ROUTINE TESTING W REFLEX): HIV 1&2 Ab, 4th Generation: NONREACTIVE

## 2021-01-25 ENCOUNTER — Ambulatory Visit: Payer: Self-pay | Admitting: Family
